# Patient Record
Sex: Male | Born: 1935 | Race: Black or African American | Hispanic: No | Smoking: Never smoker
Health system: Southern US, Community
[De-identification: ages and names within clinical notes are randomized; demographics above are authoritative.]

## PROBLEM LIST (undated history)

## (undated) DIAGNOSIS — I10 Essential (primary) hypertension: Secondary | ICD-10-CM

## (undated) DIAGNOSIS — K5900 Constipation, unspecified: Secondary | ICD-10-CM

## (undated) HISTORY — PX: BACK SURGERY: SHX140

---

## 2020-01-07 ENCOUNTER — Ambulatory Visit: Payer: No Typology Code available for payment source | Attending: Psychiatry | Admitting: Physical Therapy

## 2020-01-07 ENCOUNTER — Other Ambulatory Visit: Payer: Self-pay

## 2020-01-07 DIAGNOSIS — R29818 Other symptoms and signs involving the nervous system: Secondary | ICD-10-CM

## 2020-01-07 DIAGNOSIS — M6281 Muscle weakness (generalized): Secondary | ICD-10-CM | POA: Insufficient documentation

## 2020-01-07 DIAGNOSIS — R2681 Unsteadiness on feet: Secondary | ICD-10-CM | POA: Diagnosis present

## 2020-01-07 DIAGNOSIS — R293 Abnormal posture: Secondary | ICD-10-CM | POA: Diagnosis present

## 2020-01-10 ENCOUNTER — Encounter: Payer: Self-pay | Admitting: Physical Therapy

## 2020-01-10 NOTE — Therapy (Signed)
[facility] [address] [place], [place], [address] Phone: [phone]   Fax:  [phone]  Physical Therapy Evaluation  Patient Details  Name: Wayne Dudley MRN: [id number] Date of Birth: [birth year] Referring Provider (PT): Carrington Clamp, MD   Encounter Date: 01/07/2020  PT End of Session - 01/10/20 0915    Visit Number  1    Number of Visits  16   plus 15 visits from [place]   Date for PT Re-Evaluation  03/10/20    Authorization Type  VA - 15 authorized visits in 120 days (use by 05/08/20)    PT Start Time  1100    PT Stop Time  1145    PT Time Calculation (min)  45 min    Equipment Utilized During Treatment  Gait belt    Activity Tolerance  Patient tolerated treatment well;Patient limited by pain;Patient limited by fatigue    Behavior During Therapy  [facility] for tasks assessed/performed          01/07/20 1104  Symptoms/Limitations  Subjective Has progressive BLE weakness. Is non-ambulatory, has been using a w/c since december of 2020. Prior to that was using a RW (using that for almost 3 years). More weakness in LLE>RLE - starting in December. Feels like he has gotten weaker even since this past weekend. Scheduled to go to the neurosurgeon next month. No falls. Still undergoing workup for rapid muscle weakness, per note possibly undergoing EMG/NCV. Needs help getting out of bed, AM care, getting in and out of the shower, can no longer cook for himself or go to restroom on his own without assistance.  Son helps with stretching with legs and standing probably every other day. Son thinks he has gained his weight back now that he is eating more.  Pertinent History footpain 2/2 to frostbite injury, CKD, hard of hearing.  How long can you stand comfortably? can stand for about 1-2 minutes before having pain (in the feet and left ankle)  Diagnostic tests MRI of lumbar spine 11/10/19: multilevel degenerative changes, severe spinal  canal stenosis at L3-L4 and L5-S1  Patient Stated Goals wants to get back to where he can do for himself.  Pain Assessment  Currently in Pain? Yes  Pain Score 7  Pain Location Ankle  Pain Orientation Right;Left  Pain Descriptors / Indicators Aching  Pain Type Chronic pain (through january and december)  Aggravating Factors  standing  Pain Relieving Factors after taking tylenol       01/07/20 1115  Assessment  Medical Diagnosis progressive BLE weakness  Onset Date/Surgical Date 10/09/19  Prior Therapy had PT in [place] for 3-4 months (back when he was on his RW)  Precautions  Precautions Fall  Balance Screen  Has the patient fallen in the past 6 months No  Has the patient had a decrease in activity level because of a fear of falling?  Yes  Is the patient reluctant to leave their home because of a fear of falling?  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  Living Arrangements Children;Other (Comment) (son during the week, daughter on the weekends)  Available Help at Discharge Family  Type of Home House  Home Access Ramped entrance  Home Layout Able to live on main level with bedroom/bathroom;One level  Home Equipment Wheelchair - manual;Walker - standard;Shower seat;Other (comment) (elevated toilet seat )  Additional Comments living with family since December  Prior Function  Level of Independence Independent (prior to december)  Vocation Requirements retired Cytogeneticist  Observation/Other Assessments  Observations tightness B hamstrings/calf  Sensation  Light Touch Impaired by gross assessment  Proprioception Appears Intact (at B ankles)  Additional Comments able to detect on LLE, however pt reporting that light touch felt like therapist was touching with her fist  Coordination  Gross Motor Movements are Fluid and Coordinated No  Coordination and Movement Description unable to perform due to BLE weakness  Posture/Postural Control  Posture/Postural  Control Postural limitations  Postural Limitations Rounded Shoulders;Forward head;Posterior pelvic tilt  ROM / Strength  AROM / PROM / Strength Strength;PROM  PROM  Overall PROM Comments hypomobility B ankles and hamstrings, when lying supine pt with incr B knee flexion   Strength  Overall Strength Comments BUE: approx. 4/5 B in biceps/triceps, decr grip strength B (not formally assessed) - per pt this BUE weakness is new and started approx. 2 weeks ago  Strength Assessment Site Hip;Knee;Ankle  Right/Left Hip Right;Left  Right/Left Knee Right;Left  Right/Left Ankle Right;Left  Right Hip Flexion 2+/5  Left Hip Flexion 1/5  Right Knee Flexion 2+/5  Right Knee Extension 3+/5  Left Knee Flexion 1/5  Left Knee Extension 1/5  Right Ankle Dorsiflexion 2/5  Left Ankle Dorsiflexion 2/5  Bed Mobility  Bed Mobility Supine to Sit;Sit to Supine  Supine to Sit Supervision/Verbal cueing  Sit to Supine Supervision/Verbal cueing  Transfers  Transfers Sit to Stand;Stand to Sit;Lateral/Scoot Transfers  Sit to Stand 4: Min assist  Sit to Stand Details Visual cues for safe use of DME/AE;Verbal cues for sequencing;Verbal cues for technique;Visual cues/gestures for sequencing;Visual cues/gestures for precautions/safety  Sit to Stand Details (indicate cue type and reason) pt coming to stand with BUE support on RW   Stand to Sit 4: Min assist;With upper extremity assist;To bed  Stand to Sit Details (indicate cue type and reason) Verbal cues for sequencing;Verbal cues for technique;Visual cues for safe use of DME/AE;Verbal cues for precautions/safety  Lateral/Scoot Transfers 4: Min assist  Lateral/Scoot Transfer Details (indicate cue type and reason) from w/c <> mat table, needing cues to scoot towards edge of chair, blocking LLE   Comments standing with RW at edge of mat table: pt with forward flexed posture and knees bent B, therapist assisting by blocking LLE, 2 x 20-30 seconds, needing to sit after  reporting increased B foot pain, weight shifted more towards R    Ambulation/Gait  Gait Comments not safe to assess at this time.      Self Care: Discussed pressure relief - as pt is spending the majority of his time in his wheelchair or laying supine in the bed, discussed trying to stand briefly with son's support at least once every hour/rolling in bed and re-positioning to offset sacrum - will further discuss and educate at next session. Pt also complaining of new RLE weakness that started over the weekend and BUE weakness that is new and started approximately 2 weeks ago. Therapist educating and instructing pt and pt's son to call/message the doctor and make them aware of these new changes ASAP. Pt's son verbalized understanding and was going to send a message to the MD.     History reviewed. No pertinent past medical history.  History reviewed. No pertinent surgical history.  There were no vitals filed for this visit.                  Objective measurements completed on examination: See above findings.  PT Education - 01/10/20 0915    Education Details  clinical findings, POC, see self- care.    Person(s) Educated  Patient;Child(ren)    Methods  Explanation;Demonstration    Comprehension  Verbalized understanding;Returned demonstration       PT Short Term Goals - 01/10/20 2023      PT SHORT TERM GOAL #1   Title  Pt and pt's son/daughter will be independent in initial HEP for LE strengthening/ROM. ALL STGS DUE 02/14/20    Time  5   due to delay in scheduling   Period  Weeks    Status  New    Target Date  02/14/20   due to delay in scheduling     PT SHORT TERM GOAL #2   Title  Pt will perform sit <> stand with min guard with RW in order to improve functional LE strength.    Baseline  min A for sit <> stand, pt needing BUE on RW    Time  5    Period  Weeks    Status  New      PT SHORT TERM GOAL #3   Title  Pt and pt's son/daughter  will verbalize performing pressure relief at home in order to decr risk of pressure sores due to pt spending incr time in w/c and lying in bed.    Time  5    Period  Weeks    Status  New      PT SHORT TERM GOAL #4   Title  Pt will be able to stand for at least 3 minutes at RW with min guard in order to improve tolerance for ADLs.    Time  5    Period  Weeks    Status  New        PT Long Term Goals - 01/10/20 2026      PT LONG TERM GOAL #1   Title  Pt and pt's son/daughter will be independent in final HEP for LE strengthening/ROM. ALL LTGS DUE 03/13/20    Time  9   due to delay in scheduling   Period  Weeks    Status  New    Target Date  03/13/20      PT LONG TERM GOAL #2   Title  Pt will be able to perform squat pivot vs. stand pivot transfers with RW from w/c to mat table with supervision in order to decr caregiver burden.    Time  9    Period  Weeks    Status  New      PT LONG TERM GOAL #3   Title  Pt will perform sit <> stand from w/c and mat table with RW with supervision in order to decr caregiver burden.    Time  9    Period  Weeks    Status  New      PT LONG TERM GOAL #4   Title  Pt will tolerate standing activities at RW for at least 6 minutes in order to improve tolerance for ADLs.    Time  9    Period  Weeks    Status  New             Plan - 01/10/20 0917    Clinical Impression Statement  Patient is a 86 year old male referred to Neuro OPPT for evaluation with primary concern of progressive BLE weakness.  Pt's PMH is significant for: footpain 2/2 to frostbite injury, CKD. Pt noticed  increased weakness in LLE>RLE in December of 2020 - is now non-ambulatory and in a wheelchair. Prior to December of 2020 pt was ambulating with a RW. Pt now lives with his children and needs assistance with ADLs and IADLs. Pt's weakness is still under investigation. Pt reporting an increase in RLE weakness over the weekend and has noticed BUE weakness that started approx. 2  weeks ago. Therapist instructing pt and pt's son to let his MD know of these new changes immediately. The following deficits were present during the exam: BLE (L>R) weakness, BUE weakness, decr AROM, postural abnormalities in sitting and standing, impaired sensation, impaired static balance in standing. Pt would benefit from skilled PT to address these impairments and functional limitations to maximize functional mobility independence and decrease caregiver burden.    Personal Factors and Comorbidities  Age;Past/Current Experience    Examination-Activity Limitations  Bed Mobility;Stand;Transfers;Locomotion Level;Hygiene/Grooming;Bathing;Toileting    Examination-Participation Restrictions  Community Activity;Cleaning;Meal Prep    Stability/Clinical Decision Making  Evolving/Moderate complexity    Clinical Decision Making  Moderate    Rehab Potential  Fair    PT Frequency  2x / week    PT Duration  --   7 weeks, plus 1x week for 1 week for 15 visits total   PT Treatment/Interventions  Gait training;Therapeutic exercise;Neuromuscular re-education    PT Next Visit Plan  any update on when they followed up with MD? initial HEP for stretching/ standing with son. review pressure relief, transfers from w/c <> mat table, sit <> stands with RW from elevated surface    Recommended Other Services  potential OT consult?    Consulted and Agree with Plan of Care  Patient;Family member/caregiver    Family Member Consulted  pt's son       Patient will benefit from skilled therapeutic intervention in order to improve the following deficits and impairments:  Decreased balance, Decreased activity tolerance, Decreased coordination, Decreased knowledge of use of DME, Decreased mobility, Decreased range of motion, Difficulty walking, Decreased strength, Impaired flexibility, Postural dysfunction, Impaired sensation, Pain  Visit Diagnosis: Unsteadiness on feet - Plan: PT plan of care cert/re-cert  Muscle weakness  (generalized) - Plan: PT plan of care cert/re-cert  Other symptoms and signs involving the nervous system - Plan: PT plan of care cert/re-cert  Abnormal posture - Plan: PT plan of care cert/re-cert     Problem List There are no problems to display for this patient.   Arliss Journey, PT, DPT  01/10/2020, 8:31 PM  [facility] [address] [place], [place], [address] Phone: [phone]   Fax:  [phone]  Name: Wayne Dudley MRN: [id number] Date of Birth: 1923-08-28

## 2020-01-18 ENCOUNTER — Other Ambulatory Visit: Payer: Self-pay

## 2020-01-18 ENCOUNTER — Ambulatory Visit: Payer: No Typology Code available for payment source | Admitting: Physical Therapy

## 2020-01-18 DIAGNOSIS — M6281 Muscle weakness (generalized): Secondary | ICD-10-CM

## 2020-01-18 DIAGNOSIS — R293 Abnormal posture: Secondary | ICD-10-CM

## 2020-01-18 DIAGNOSIS — R29818 Other symptoms and signs involving the nervous system: Secondary | ICD-10-CM

## 2020-01-18 DIAGNOSIS — R2681 Unsteadiness on feet: Secondary | ICD-10-CM

## 2020-01-18 NOTE — Patient Instructions (Signed)
Access Code: [email or web address] URL: [email or web address] Date: 01/18/2020 Prepared by: Sherlie Ban  Program Notes lay on your back with towels rolled up under the outside of feet so toes are pointed up, stretching the hamstrings (back of the legs) lying down - hold this position approx. 3-5 minutes    keep working on standing with the walker.    Exercises Seated Hip Abduction with Resistance - 2 x daily - 5 x weekly - 2 sets - 10 reps Seated Hip Adduction Isometrics with Ball - 2 x daily - 5 x weekly - 2 sets - 10 reps Supine Gluteal Sets - 2 x daily - 5 x weekly - 2 sets - 10 reps Seated Heel Slide - 2 x daily - 5 x weekly - 2 sets - 10 reps Supine Hip and Knee Flexion PROM with Caregiver - 2 x daily - 5 x weekly - 3 sets - 20 hold Supine Ankle Dorsiflexion Stretch with Caregiver - 2 x daily - 5 x weekly - 3 sets - 20 hold

## 2020-01-18 NOTE — Therapy (Signed)
[facility] [address] [place], [place], [address] Phone: [phone]   Fax:  [phone]  Physical Therapy Treatment  Patient Details  Name: Wayne Dudley MRN: [id number] Date of Birth: [birth year] Referring Provider (PT): Carrington Clamp, MD   Encounter Date: 01/18/2020  PT End of Session - 01/18/20 1227    Visit Number  2    Number of Visits  16   plus 15 visits from [place]   Date for PT Re-Evaluation  03/10/20    Authorization Type  VA - 15 authorized visits in 120 days (use by 05/08/20)    PT Start Time  1103    PT Stop Time  1147    PT Time Calculation (min)  44 min    Equipment Utilized During Treatment  Gait belt    Activity Tolerance  Patient tolerated treatment well    Behavior During Therapy  [facility] for tasks assessed/performed       No past medical history on file.  No past surgical history on file.  There were no vitals filed for this visit.  Subjective Assessment - 01/18/20 1108    Subjective  Went to the neurosurgeon on the 15th - took a look at his MRIs and x-rays, the surgeon was saying the only way to correct the BLE weakness would be surgery, but not going to go that route. No falls. Becoming weaker on the right side.    Pertinent History  footpain 2/2 to frostbite injury, CKD, hard of hearing.    How long can you stand comfortably?  can stand for about 1-2 minutes before having pain (in the feet and left ankle)    Diagnostic tests  MRI of lumbar spine 11/10/19: multilevel degenerative changes, severe spinal canal stenosis at L3-L4 and L5-S1    Patient Stated Goals  wants to get back to where he can do for himself.    Currently in Pain?  Yes    Pain Score  6     Pain Location  Ankle    Pain Orientation  Right;Left    Pain Descriptors / Indicators  Burning    Aggravating Factors   standing                      Access Code: [email or web address] URL: [email or web address] Date:  01/18/2020 Prepared by: Sherlie Ban  Initiated pt's HEP for strengthening/ROM.   Program Notes lay on your back with towels rolled up under the outside of feet so toes are pointed up, stretching the hamstrings (back of the legs) lying down - hold this position approx. 3-5 minutes    keep working on standing with the walker (with son with you)    Exercises Seated Hip Abduction with Resistance - 2 x daily - 5 x weekly - 2 sets - 10 reps -with yellow theraband, alternating between R and L 2 x 10 reps  Seated Hip Adduction Isometrics with Ball - 2 x daily - 5 x week 2 sets - 10 reps -cues for technique 2 x 10 reps  Supine Gluteal Sets - 2 x daily - 5 x weekly - 2 sets - 10 reps - verbal and tactile cues for technique 2 x 10 reps, pt with poor muscle contraction on L  Seated Heel Slide - 2 x daily - 5 x weekly - 2 sets - 10 reps - AAROM with towel on floor, 2 x 10 reps on R, 2 x 10 reps on L with  therapist needing to assist back into knee flexion  Supine Hip and Knee Flexion PROM with Caregiver - 2 x daily - 5 x weekly - 3 sets - 20 hold - therapist demonstrating proper tehcnique for home stretch  Supine Ankle Dorsiflexion Stretch with Caregiver - 2 x daily - 5 x weekly - 3 sets - 20 hold  [facility] Adult PT Treatment/Exercise - 01/18/20 0001      Bed Mobility   Bed Mobility  Supine to Sit;Sit to Supine    Supine to Sit  Minimal Assistance - Patient > 75%   to help bring BLE off of mat table, assist at trunk   Sit to Supine  Minimal Assistance - Patient > 75%;Other (comment)   to help bring LLE onto mat     Transfers   Lateral/Scoot Transfers  4: Min assist;4: Min guard    Lateral/Scoot Transfer Details (indicate cue type and reason)  from w/c <> mat table, first rep going to pt's LLE (weaker side) - needing cues to scoot towards edge of w/c and min A for safety with transfer, at end of session with pt transferring back to R (stronger side) pt only requiring only min guard      Exercises    Exercises  --             PT Education - 01/18/20 1226    Education Details  initial HEP, calling MD to make him aware of BUE weakness (did not let him know at recent neurosurgeon appt)    Person(s) Educated  Patient;Child(ren)    Methods  Explanation;Demonstration;Handout    Comprehension  Verbalized understanding;Returned demonstration       PT Short Term Goals - 01/10/20 2023      PT SHORT TERM GOAL #1   Title  Pt and pt's son/daughter will be independent in initial HEP for LE strengthening/ROM. ALL STGS DUE 02/14/20    Time  5   due to delay in scheduling   Period  Weeks    Status  New    Target Date  02/14/20   due to delay in scheduling     PT SHORT TERM GOAL #2   Title  Pt will perform sit <> stand with min guard with RW in order to improve functional LE strength.    Baseline  min A for sit <> stand, pt needing BUE on RW    Time  5    Period  Weeks    Status  New      PT SHORT TERM GOAL #3   Title  Pt and pt's son/daughter will verbalize performing pressure relief at home in order to decr risk of pressure sores due to pt spending incr time in w/c and lying in bed.    Time  5    Period  Weeks    Status  New      PT SHORT TERM GOAL #4   Title  Pt will be able to stand for at least 3 minutes at RW with min guard in order to improve tolerance for ADLs.    Time  5    Period  Weeks    Status  New        PT Long Term Goals - 01/10/20 2026      PT LONG TERM GOAL #1   Title  Pt and pt's son/daughter will be independent in final HEP for LE strengthening/ROM. ALL LTGS DUE 03/13/20    Time  9  due to delay in scheduling   Period  Weeks    Status  New    Target Date  03/13/20      PT LONG TERM GOAL #2   Title  Pt will be able to perform squat pivot vs. stand pivot transfers with RW from w/c to mat table with supervision in order to decr caregiver burden.    Time  9    Period  Weeks    Status  New      PT LONG TERM GOAL #3   Title  Pt will perform sit <>  stand from w/c and mat table with RW with supervision in order to decr caregiver burden.    Time  9    Period  Weeks    Status  New      PT LONG TERM GOAL #4   Title  Pt will tolerate standing activities at RW for at least 6 minutes in order to improve tolerance for ADLs.    Time  9    Period  Weeks    Status  New            Plan - 01/18/20 1237    Clinical Impression Statement  Focus of today's skilled session was initiating pt's HEP for seated/supine strengthening and for stretching to be assisted by a caregiver (pt's son Jeneen Rinks present throughout session). pt with increased weakness LLE>RLE. Pt with hypomobility of B hamstrings and B hip flexors/extensors, with pt reporting relief from stretching. Pt needing min A to transfer towards pt's L with cues for technique as pt always transfers to R side from his w/c. Will continue to progress towards LTGs.    Personal Factors and Comorbidities  Age;Past/Current Experience    Examination-Activity Limitations  Bed Mobility;Stand;Transfers;Locomotion Level;Hygiene/Grooming;Bathing;Toileting    Examination-Participation Restrictions  Community Activity;Cleaning;Meal Prep    Stability/Clinical Decision Making  Evolving/Moderate complexity    Rehab Potential  Fair    PT Frequency  2x / week    PT Duration  --   7 weeks, plus 1x week for 1 week for 15 visits total   PT Treatment/Interventions  Gait training;Therapeutic exercise;Neuromuscular re-education    PT Next Visit Plan  any update on when they followed up with MD? how was HEP? standing at countertop. review pressure relief, transfers from w/c <> mat table, sit <> stands with RW from elevated surface    Consulted and Agree with Plan of Care  Patient;Family member/caregiver    Family Member Consulted  pt's son       Patient will benefit from skilled therapeutic intervention in order to improve the following deficits and impairments:  Decreased balance, Decreased activity tolerance,  Decreased coordination, Decreased knowledge of use of DME, Decreased mobility, Decreased range of motion, Difficulty walking, Decreased strength, Impaired flexibility, Postural dysfunction, Impaired sensation, Pain  Visit Diagnosis: Muscle weakness (generalized)  Unsteadiness on feet  Other symptoms and signs involving the nervous system  Abnormal posture     Problem List There are no problems to display for this patient.   Arliss Journey, PT, DPT  01/18/2020, 12:40 PM  [place] [address] [place], [place], [address] Phone: [phone]   Fax:  [phone]  Name: Wayne Dudley MRN: [id number] Date of Birth: 06-08-1923

## 2020-01-25 ENCOUNTER — Other Ambulatory Visit: Payer: Self-pay

## 2020-01-25 ENCOUNTER — Ambulatory Visit: Payer: No Typology Code available for payment source | Admitting: Physical Therapy

## 2020-01-25 VITALS — BP 161/61 | HR 63

## 2020-01-25 DIAGNOSIS — R2681 Unsteadiness on feet: Secondary | ICD-10-CM | POA: Diagnosis not present

## 2020-01-25 DIAGNOSIS — M6281 Muscle weakness (generalized): Secondary | ICD-10-CM

## 2020-01-25 DIAGNOSIS — R29818 Other symptoms and signs involving the nervous system: Secondary | ICD-10-CM

## 2020-01-25 DIAGNOSIS — R293 Abnormal posture: Secondary | ICD-10-CM

## 2020-01-27 NOTE — Therapy (Signed)
[facility] [address] [place], [place], [address] Phone: [phone]   Fax:  [phone]  Physical Therapy Treatment  Patient Details  Name: Wayne Dudley MRN: [id number] Date of Birth: 1923-06-21 Referring Provider (PT): Carrington Clamp, MD   Encounter Date: 01/25/2020  PT End of Session - 01/27/20 1503    Visit Number  3    Number of Visits  16   plus 15 visits from [place]   Date for PT Re-Evaluation  03/10/20    Authorization Type  VA - 15 authorized visits in 120 days (use by 05/08/20)    PT Start Time  1102    PT Stop Time  1147    PT Time Calculation (min)  45 min    Equipment Utilized During Treatment  Gait belt    Activity Tolerance  Patient tolerated treatment well    Behavior During Therapy  [facility] for tasks assessed/performed       No past medical history on file.  No past surgical history on file.  Vitals:   01/25/20 1108  BP: (!) 161/61  Pulse: 63                      [facility] Adult PT Treatment/Exercise - 01/27/20 0001      Bed Mobility   Bed Mobility  Supine to Sit;Sit to Supine    Supine to Sit  Minimal Assistance - Patient > 75%   for assist with BLEs   Sit to Supine  Minimal Assistance - Patient > 75%;Other (comment)   for assist with BLEs     Transfers   Transfers  Sit to Stand;Stand to Sit;Squat Pivot Transfers    Sit to Stand  4: Min assist    Sit to Stand Details  Verbal cues for sequencing;Verbal cues for technique;Tactile cues for placement    Sit to Stand Details (indicate cue type and reason)  at countertop with single UE support on counter and single UE support from w/c x3, pt with uncontrolled descent while sitting in chair    Squat Pivot Transfers  4: Min assist;4: Min Product/process development scientist Details (indicate cue type and reason)  going towards LLE - min A, min guard going towards R back into w/c from mat table, verbal and demonstrative cues for head hips  relationship for squat pivot transfer    Lateral/Scoot Transfers  --    Lateral/Scoot Transfer Details (indicate cue type and reason)  --    Comments  Standing at countertop 3 x 30-45 second bouts with BUE support at sink, min A, pt with incr forward flexed posture. HR after standing: 74 bpm      Exercises   Exercises  Other Exercises    Other Exercises   Supine on mat table in hooklying position and bolster under BLEs: 2 x 10 reps SAQs on RLE, 1 x 10 AAROM SAQs on LLE, with LLE extended attempted quad sets on LLE - with trace contraction and pt compensating by using glutes. In hooklying position (needed bolster propped next to RLE and therapist holding LLE in place to prevent hip ADD) 1 x 10 reps hip ADD with ball, therapist assisting by holding ball, 1 x 10 reps hip ABD - asking pt to gently press out into therapist's hands. PROM and stretch into hip and knee flexion with additional calf stretch B.  PT Short Term Goals - 01/10/20 2023      PT SHORT TERM GOAL #1   Title  Pt and pt's son/daughter will be independent in initial HEP for LE strengthening/ROM. ALL STGS DUE 02/14/20    Time  5   due to delay in scheduling   Period  Weeks    Status  New    Target Date  02/14/20   due to delay in scheduling     PT SHORT TERM GOAL #2   Title  Pt will perform sit <> stand with min guard with RW in order to improve functional LE strength.    Baseline  min A for sit <> stand, pt needing BUE on RW    Time  5    Period  Weeks    Status  New      PT SHORT TERM GOAL #3   Title  Pt and pt's son/daughter will verbalize performing pressure relief at home in order to decr risk of pressure sores due to pt spending incr time in w/c and lying in bed.    Time  5    Period  Weeks    Status  New      PT SHORT TERM GOAL #4   Title  Pt will be able to stand for at least 3 minutes at RW with min guard in order to improve tolerance for ADLs.    Time  5    Period  Weeks    Status  New         PT Long Term Goals - 01/10/20 2026      PT LONG TERM GOAL #1   Title  Pt and pt's son/daughter will be independent in final HEP for LE strengthening/ROM. ALL LTGS DUE 03/13/20    Time  9   due to delay in scheduling   Period  Weeks    Status  New    Target Date  03/13/20      PT LONG TERM GOAL #2   Title  Pt will be able to perform squat pivot vs. stand pivot transfers with RW from w/c to mat table with supervision in order to decr caregiver burden.    Time  9    Period  Weeks    Status  New      PT LONG TERM GOAL #3   Title  Pt will perform sit <> stand from w/c and mat table with RW with supervision in order to decr caregiver burden.    Time  9    Period  Weeks    Status  New      PT LONG TERM GOAL #4   Title  Pt will tolerate standing activities at RW for at least 6 minutes in order to improve tolerance for ADLs.    Time  9    Period  Weeks    Status  New            Plan - 01/27/20 1515    Clinical Impression Statement  Focus of today's skilled session was BLE PROM and strengthening, transfer training, and standing at sink with BUE support. Pt needing verbal and demonstrative cues for head hips relationship - pt needing min A when transferring to pt's L. Pt with trace L quad activation when attempting supine quad sets. Unable to maintain hooklying positon with knees bent without external support of bolster/therapist due to weakness. Will continue to progress towards LTGs.    Personal Factors and Comorbidities  Age;Past/Current Experience    Examination-Activity Limitations  Bed Mobility;Stand;Transfers;Locomotion Level;Hygiene/Grooming;Bathing;Toileting    Examination-Participation Restrictions  Community Activity;Cleaning;Meal Prep    Stability/Clinical Decision Making  Evolving/Moderate complexity    Rehab Potential  Fair    PT Frequency  2x / week    PT Duration  --   7 weeks, plus 1x week for 1 week for 15 visits total   PT Treatment/Interventions  Gait  training;Therapeutic exercise;Neuromuscular re-education    PT Next Visit Plan  how is HEP going?  supine/hooklying BLE strengthening/PROM, review pressure relief, transfers from w/c <> mat table, sit <> stands/standing at sink    Consulted and Agree with Plan of Care  Patient;Family member/caregiver    Family Member Consulted  pt's son       Patient will benefit from skilled therapeutic intervention in order to improve the following deficits and impairments:  Decreased balance, Decreased activity tolerance, Decreased coordination, Decreased knowledge of use of DME, Decreased mobility, Decreased range of motion, Difficulty walking, Decreased strength, Impaired flexibility, Postural dysfunction, Impaired sensation, Pain  Visit Diagnosis: Muscle weakness (generalized)  Unsteadiness on feet  Other symptoms and signs involving the nervous system  Abnormal posture     Problem List There are no problems to display for this patient.   Arliss Journey, PT, DPT  01/27/2020, 3:22 PM  [facility] [address] [place], [place], [address] Phone: [phone]   Fax:  [phone]  Name: Wayne Dudley MRN: [id number] Date of Birth: Jun 30, 1923

## 2020-01-30 ENCOUNTER — Other Ambulatory Visit: Payer: Self-pay

## 2020-01-30 ENCOUNTER — Ambulatory Visit: Payer: No Typology Code available for payment source

## 2020-01-30 DIAGNOSIS — R2681 Unsteadiness on feet: Secondary | ICD-10-CM | POA: Diagnosis not present

## 2020-01-30 DIAGNOSIS — M6281 Muscle weakness (generalized): Secondary | ICD-10-CM

## 2020-01-30 NOTE — Therapy (Signed)
[facility] [address] [place], [place], [address] Phone: [phone]   Fax:  [phone]  Physical Therapy Treatment  Patient Details  Name: Wayne Dudley MRN: [id number] Date of Birth: 10-Jul-1923 Referring Provider (PT): [name], MD   Encounter Date: 01/30/2020  PT End of Session - 01/30/20 1105    Visit Number  4    Number of Visits  16   plus 15 visits from [place]   Date for PT Re-Evaluation  03/10/20    Authorization Type  VA - 15 authorized visits in 120 days (use by 05/08/20)    PT Start Time  1102    PT Stop Time  1147    PT Time Calculation (min)  45 min    Equipment Utilized During Treatment  Gait belt    Activity Tolerance  Patient tolerated treatment well    Behavior During Therapy  [facility] for tasks assessed/performed       History reviewed. No pertinent past medical history.  History reviewed. No pertinent surgical history.  There were no vitals filed for this visit.  Subjective Assessment - 01/30/20 1108    Subjective  Pt's son reports that he did speak to the doctor via messages and notify him of UE weakness. They wanted more information and he let them know that therapist would call.    Pertinent History  footpain 2/2 to frostbite injury, CKD, hard of hearing.    How long can you stand comfortably?  can stand for about 1-2 minutes before having pain (in the feet and left ankle)    Diagnostic tests  MRI of lumbar spine 11/10/19: multilevel degenerative changes, severe spinal canal stenosis at L3-L4 and L5-S1    Patient Stated Goals  wants to get back to where he can do for himself.    Currently in Pain?  Yes    Pain Score  6     Pain Location  Foot    Pain Orientation  Right;Left    Pain Descriptors / Indicators  Burning    Pain Type  Chronic pain                       [facility] Adult PT Treatment/Exercise - 01/30/20 1129      Transfers   Transfers  Sit to Stand;Stand to Sit     Sit to Stand  4: Min assist    Sit to Stand Details  Verbal cues for technique    Sit to Stand Details (indicate cue type and reason)  at sink with 1 hand on sink and 1 hand on chair pushing up.    Stand to Sit  4: Min assist    Stand to Sit Details (indicate cue type and reason)  Verbal cues for technique    Squat Pivot Transfers  4: Min assist    Squat Pivot Transfer Details (indicate cue type and reason)  w/c to/from mat x 2    Comments  Pt stood 1 min x 2 and then 47 sec at sink with verbal and tactile cues to try to tighten bottom and quads and keep head up tall. As patient is up longer knees more flexed and start to touch cabinets. PT suggested pillow in front of knees at home if this is causing discomfort      Self-Care   Self-Care  Other Self-Care Comments    Other Self-Care Comments   Pt and son educated on pressure relief in sitting.  Need to change positions every 20 min for 2 min at a time. Discussed different options but pt able to self perform lateral lean with PT able to get hand under coccyx as providing adequate relief. Also encouraged him to stand with son's assist at counter/sink frequently.      Exercises   Exercises  Other Exercises    Other Exercises   PT reviewed HEP with patient and son: seated edge of mat hip abduction isometrics with light manual resistance x 10 with minimal activation right>left. Isometric hip adduction squeezing pillow x 10 with minimal activation. Seated heel slides x 6 each leg with towel under foot to help allow more slide through partial range. Pt was able to extend to slide right foot back out but not on left. Son verbalized performing supine stretching/ROM with patient.             PT Education - 01/30/20 1636    Education Details  Pt to continue with current HEP. Pressure relief.    Person(s) Educated  Patient;Child(ren)    Methods  Explanation    Comprehension  Verbalized understanding       PT Short Term Goals - 01/10/20 2023       PT SHORT TERM GOAL #1   Title  Pt and pt's son/daughter will be independent in initial HEP for LE strengthening/ROM. ALL STGS DUE 02/14/20    Time  5   due to delay in scheduling   Period  Weeks    Status  New    Target Date  02/14/20   due to delay in scheduling     PT SHORT TERM GOAL #2   Title  Pt will perform sit <> stand with min guard with RW in order to improve functional LE strength.    Baseline  min A for sit <> stand, pt needing BUE on RW    Time  5    Period  Weeks    Status  New      PT SHORT TERM GOAL #3   Title  Pt and pt's son/daughter will verbalize performing pressure relief at home in order to decr risk of pressure sores due to pt spending incr time in w/c and lying in bed.    Time  5    Period  Weeks    Status  New      PT SHORT TERM GOAL #4   Title  Pt will be able to stand for at least 3 minutes at RW with min guard in order to improve tolerance for ADLs.    Time  5    Period  Weeks    Status  New        PT Long Term Goals - 01/10/20 2026      PT LONG TERM GOAL #1   Title  Pt and pt's son/daughter will be independent in final HEP for LE strengthening/ROM. ALL LTGS DUE 03/13/20    Time  9   due to delay in scheduling   Period  Weeks    Status  New    Target Date  03/13/20      PT LONG TERM GOAL #2   Title  Pt will be able to perform squat pivot vs. stand pivot transfers with RW from w/c to mat table with supervision in order to decr caregiver burden.    Time  9    Period  Weeks    Status  New      PT LONG  TERM GOAL #3   Title  Pt will perform sit <> stand from w/c and mat table with RW with supervision in order to decr caregiver burden.    Time  9    Period  Weeks    Status  New      PT LONG TERM GOAL #4   Title  Pt will tolerate standing activities at RW for at least 6 minutes in order to improve tolerance for ADLs.    Time  9    Period  Weeks    Status  New            Plan - 01/30/20 1637    Clinical Impression Statement  Pt  was able to increase standing time at counter today with more upright posture. Knees do flex as goes on. Pt wil continue to benefit from PT to continue with transfers, standing and strengthening.    Personal Factors and Comorbidities  Age;Past/Current Experience    Examination-Activity Limitations  Bed Mobility;Stand;Transfers;Locomotion Level;Hygiene/Grooming;Bathing;Toileting    Examination-Participation Restrictions  Community Activity;Cleaning;Meal Prep    Stability/Clinical Decision Making  Evolving/Moderate complexity    Rehab Potential  Fair    PT Frequency  2x / week    PT Duration  --   7 weeks, plus 1x week for 1 week for 15 visits total   PT Treatment/Interventions  Gait training;Therapeutic exercise;Neuromuscular re-education    PT Next Visit Plan  supine/hooklying BLE strengthening/PROM, review pressure relief, transfers from w/c <> mat table, sit <> stands/standing at sink    Consulted and Agree with Plan of Care  Patient;Family member/caregiver    Family Member Consulted  pt's son       Patient will benefit from skilled therapeutic intervention in order to improve the following deficits and impairments:  Decreased balance, Decreased activity tolerance, Decreased coordination, Decreased knowledge of use of DME, Decreased mobility, Decreased range of motion, Difficulty walking, Decreased strength, Impaired flexibility, Postural dysfunction, Impaired sensation, Pain  Visit Diagnosis: Muscle weakness (generalized)  Unsteadiness on feet     Problem List There are no problems to display for this patient.   Ronn Melena, PT, DPT, NCS 01/30/2020, 4:41 PM  [facility] [address] [place], [place], [address] Phone: [phone]   Fax:  [phone]  Name: Wayne Dudley MRN: [id number] Date of Birth: 1923/05/02

## 2020-02-05 ENCOUNTER — Ambulatory Visit: Payer: No Typology Code available for payment source | Attending: Psychiatry | Admitting: Physical Therapy

## 2020-02-05 ENCOUNTER — Other Ambulatory Visit: Payer: Self-pay

## 2020-02-05 DIAGNOSIS — R293 Abnormal posture: Secondary | ICD-10-CM | POA: Insufficient documentation

## 2020-02-05 DIAGNOSIS — M6281 Muscle weakness (generalized): Secondary | ICD-10-CM | POA: Diagnosis present

## 2020-02-05 DIAGNOSIS — R29818 Other symptoms and signs involving the nervous system: Secondary | ICD-10-CM | POA: Insufficient documentation

## 2020-02-05 DIAGNOSIS — R2681 Unsteadiness on feet: Secondary | ICD-10-CM | POA: Insufficient documentation

## 2020-02-05 NOTE — Patient Instructions (Signed)
Access Code: [email or web address] URL: [email or web address] Date: 02/05/2020 Prepared by: Sherlie Ban  Program Notes lay on your back with towels rolled up under the outside of feet so toes are pointed up, stretching the hamstrings (back of the legs) lying down - hold this position approx. 3-5 minutes    keep working on standing with the walker.    Exercises Seated Hip Abduction with Resistance - 2 x daily - 5 x weekly - 2 sets - 10 reps Seated Hip Adduction Isometrics with Ball - 2 x daily - 5 x weekly - 2 sets - 10 reps Supine Gluteal Sets - 2 x daily - 5 x weekly - 2 sets - 10 reps Seated Heel Slide - 2 x daily - 5 x weekly - 2 sets - 10 reps Supine Hip and Knee Flexion PROM with Caregiver - 2 x daily - 5 x weekly - 3 sets - 20 hold Supine Ankle Dorsiflexion Stretch with Caregiver - 2 x daily - 5 x weekly - 3 sets - 20 hold Seated Scapular Retraction - 1-2 x daily - 5 x weekly - 2 sets - 10 reps

## 2020-02-05 NOTE — Therapy (Signed)
[facility] [address] [place], [place], [address] Phone: [phone]   Fax:  [phone]  Physical Therapy Treatment  Patient Details  Name: Precious Gilchrest MRN: [id number] Date of Birth: 19-May-1923 Referring Provider (PT): [name], MD   Encounter Date: 02/05/2020  PT End of Session - 02/05/20 1150    Visit Number  5    Number of Visits  16   plus 15 visits from [place]   Date for PT Re-Evaluation  03/10/20    Authorization Type  VA - 15 authorized visits in 120 days (use by 05/08/20)    PT Start Time  1059    PT Stop Time  1145    PT Time Calculation (min)  46 min    Equipment Utilized During Treatment  Gait belt    Activity Tolerance  Patient tolerated treatment well    Behavior During Therapy  [facility] for tasks assessed/performed       No past medical history on file.  No past surgical history on file.  There were no vitals filed for this visit.  Subjective Assessment - 02/05/20 1102    Subjective  Has an appointment to see the doctor tomorrow. Reports that his left leg feels like it is getting stronger. Reports doing his pressure relif in his chair. Has been doing some standing at the countertop.    Pertinent History  footpain 2/2 to frostbite injury, CKD, hard of hearing.    How long can you stand comfortably?  can stand for about 1-2 minutes before having pain (in the feet and left ankle)    Diagnostic tests  MRI of lumbar spine 11/10/19: multilevel degenerative changes, severe spinal canal stenosis at L3-L4 and L5-S1    Patient Stated Goals  wants to get back to where he can do for himself.    Currently in Pain?  Yes    Pain Score  6     Pain Location  Foot    Pain Orientation  Right;Left    Pain Descriptors / Indicators  Burning                       [facility] Adult PT Treatment/Exercise - 02/05/20 0001      Bed Mobility   Bed Mobility  Supine to Sit;Sit to Supine    Supine to Sit   Minimal Assistance - Patient > 75%   for assist with BLEs   Sit to Supine  Minimal Assistance - Patient > 75%;Other (comment)   assist with LLE onto mat     Transfers   Transfers  Sit to Stand;Stand to Sit    Sit to Stand  4: Min assist    Sit to Stand Details  Verbal cues for technique    Sit to Stand Details (indicate cue type and reason)  at sink with 1 hand on sink and one hand on chair pushing up x3 reps    Stand to Sit  4: Min assist    Stand to Sit Details (indicate cue type and reason)  Verbal cues for technique    Squat Pivot Transfers  4: Min assist    Squat Pivot Transfer Details (indicate cue type and reason)  w/c <> mat table x2 reps - needed verbal and demonstrative cues for head/hips relationship     Comments  Standing at  sink countertop with BUE support 1 minute x2 reps and an additional 45 seconds, verbal and tactile cues for glute  activation and standing up tall. Cues to keep head up as well. Use of pillow in front of knees due to pt having incr knee flexion as time went on       Exercises   Exercises  Other Exercises    Other Exercises   Seated on mat table: 2 x 10 reps scap retraction - needed verbal and tactile cues for technique, added to HEP. In hookyling on mat table with BLE over bolster: 2 x 10 reps SAQs with RLE, needing min A to hold RLE in proper positioning, visual cue for having pt to kick up to therapist's hands, 2 x 10 reps AAROM SAQs with LLE, pt able to activate L quads, therapist assisting with remainder of motion. In hooklying position (therapist supporting one leg and bolster supporting other leg to prevent it from ABDucting) - 1 x 10 reps therapist manual resistance and AAROM hip ADD/ABD on L, 1 x 10 reps on RLE, with minimal activation L>R. Therapist performing PROM of hip/knee flexion with prolonged stretch 3 x 1 min B.               PT Education - 02/05/20 1150    Education Details  scapular retraction seated addition to HEP    Person(s)  Educated  Patient;Child(ren)    Methods  Explanation;Demonstration    Comprehension  Verbalized understanding;Returned demonstration       PT Short Term Goals - 01/10/20 2023      PT SHORT TERM GOAL #1   Title  Pt and pt's son/daughter will be independent in initial HEP for LE strengthening/ROM. ALL STGS DUE 02/14/20    Time  5   due to delay in scheduling   Period  Weeks    Status  New    Target Date  02/14/20   due to delay in scheduling     PT SHORT TERM GOAL #2   Title  Pt will perform sit <> stand with min guard with RW in order to improve functional LE strength.    Baseline  min A for sit <> stand, pt needing BUE on RW    Time  5    Period  Weeks    Status  New      PT SHORT TERM GOAL #3   Title  Pt and pt's son/daughter will verbalize performing pressure relief at home in order to decr risk of pressure sores due to pt spending incr time in w/c and lying in bed.    Time  5    Period  Weeks    Status  New      PT SHORT TERM GOAL #4   Title  Pt will be able to stand for at least 3 minutes at RW with min guard in order to improve tolerance for ADLs.    Time  5    Period  Weeks    Status  New        PT Long Term Goals - 01/10/20 2026      PT LONG TERM GOAL #1   Title  Pt and pt's son/daughter will be independent in final HEP for LE strengthening/ROM. ALL LTGS DUE 03/13/20    Time  9   due to delay in scheduling   Period  Weeks    Status  New    Target Date  03/13/20      PT LONG TERM GOAL #2   Title  Pt will be able to perform squat pivot vs. stand  pivot transfers with RW from w/c to mat table with supervision in order to decr caregiver burden.    Time  9    Period  Weeks    Status  New      PT LONG TERM GOAL #3   Title  Pt will perform sit <> stand from w/c and mat table with RW with supervision in order to decr caregiver burden.    Time  9    Period  Weeks    Status  New      PT LONG TERM GOAL #4   Title  Pt will tolerate standing activities at RW for  at least 6 minutes in order to improve tolerance for ADLs.    Time  9    Period  Weeks    Status  New            Plan - 02/05/20 1205    Clinical Impression Statement  Pt able to stand for a max of 1 minute at the countertop today with BUE support at sink, needed intermittent cues for tall posture throughout. Pt continues to need verbal and demonstrative cues for squat pivot transfers to and from w/c <> mat table. Added seated scapular retraction to HEP for postural strengthening today, pt able to perform after initial verbal and tactile cues. Will continue to progress towards LTGs.    Personal Factors and Comorbidities  Age;Past/Current Experience    Examination-Activity Limitations  Bed Mobility;Stand;Transfers;Locomotion Level;Hygiene/Grooming;Bathing;Toileting    Examination-Participation Restrictions  Community Activity;Cleaning;Meal Prep    Stability/Clinical Decision Making  Evolving/Moderate complexity    Rehab Potential  Fair    PT Frequency  2x / week    PT Duration  --   7 weeks, plus 1x week for 1 week for 15 visits total   PT Treatment/Interventions  Gait training;Therapeutic exercise;Neuromuscular re-education    PT Next Visit Plan  how was MD visit ? supine/hooklying BLE strengthening/PROM, review pressure relief, transfers from w/c <> mat table, sit <> stands/standing at sink    Consulted and Agree with Plan of Care  Patient;Family member/caregiver    Family Member Consulted  pt's son       Patient will benefit from skilled therapeutic intervention in order to improve the following deficits and impairments:  Decreased balance, Decreased activity tolerance, Decreased coordination, Decreased knowledge of use of DME, Decreased mobility, Decreased range of motion, Difficulty walking, Decreased strength, Impaired flexibility, Postural dysfunction, Impaired sensation, Pain  Visit Diagnosis: Other symptoms and signs involving the nervous system  Abnormal posture  Muscle  weakness (generalized)  Unsteadiness on feet     Problem List There are no problems to display for this patient.   Drake Leach, PT, DPT  02/05/2020, 12:07 PM  [facility] [address] [place], [place], [address] Phone: [phone]   Fax:  [phone]  Name: Burak Zerbe MRN: [id number] Date of Birth: 04/05/1923

## 2020-02-08 ENCOUNTER — Ambulatory Visit: Payer: No Typology Code available for payment source | Admitting: Physical Therapy

## 2020-02-08 ENCOUNTER — Other Ambulatory Visit: Payer: Self-pay

## 2020-02-08 DIAGNOSIS — R2681 Unsteadiness on feet: Secondary | ICD-10-CM

## 2020-02-08 DIAGNOSIS — R293 Abnormal posture: Secondary | ICD-10-CM

## 2020-02-08 DIAGNOSIS — R29818 Other symptoms and signs involving the nervous system: Secondary | ICD-10-CM

## 2020-02-08 DIAGNOSIS — M6281 Muscle weakness (generalized): Secondary | ICD-10-CM

## 2020-02-08 NOTE — Therapy (Signed)
[facility] [address] [place], [place], [address] Phone: [phone]   Fax:  [phone]  Physical Therapy Treatment  Patient Details  Name: Wayne Dudley MRN: [id number] Date of Birth: [birth year] Referring Provider (PT): Carrington Clamp, MD   Encounter Date: 02/08/2020  PT End of Session - 02/08/20 1802    Visit Number  6    Number of Visits  16   plus 15 visits from [place]   Date for PT Re-Evaluation  03/10/20    Authorization Type  VA - 15 authorized visits in 120 days (use by 05/08/20)    PT Start Time  1103    PT Stop Time  1145    PT Time Calculation (min)  42 min    Equipment Utilized During Treatment  Gait belt    Activity Tolerance  Patient tolerated treatment well    Behavior During Therapy  [facility] for tasks assessed/performed       No past medical history on file.  No past surgical history on file.  There were no vitals filed for this visit.  Subjective Assessment - 02/08/20 1108    Subjective  Saw the doctor on Wednesday. Not getting any more further tests or x-rays for BUE weakness. States that he instead is getting an orthopedic PT referral.    Pertinent History  footpain 2/2 to frostbite injury, CKD, hard of hearing.    How long can you stand comfortably?  can stand for about 1-2 minutes before having pain (in the feet and left ankle)    Diagnostic tests  MRI of lumbar spine 11/10/19: multilevel degenerative changes, severe spinal canal stenosis at L3-L4 and L5-S1    Patient Stated Goals  wants to get back to where he can do for himself.    Currently in Pain?  Yes    Pain Score  6     Pain Location  Foot    Pain Orientation  Right;Left    Pain Descriptors / Indicators  Burning    Pain Type  Chronic pain                       [facility] Adult PT Treatment/Exercise - 02/08/20 0001      Transfers   Transfers  Sit to Stand;Stand to Sit    Sit to Stand  4: Min assist    Sit to Stand  Details  Verbal cues for technique    Sit to Stand Details (indicate cue type and reason)  at sink - 2 reps with BUE on sink, 2 reps with one hand on sink and one hand on w/c     Stand to Sit  4: Min assist    Stand to Sit Details (indicate cue type and reason)  Verbal cues for technique    Stand to Sit Details  pt with poor eccentric control when sitting back to chair after standing     Squat Pivot Transfers  4: Min assist   min guard   Squat Pivot Transfer Details (indicate cue type and reason)  w/c <> mat table x2 reps, pt needing verbal cues for proper technique    Comments  Standing at sink with w/c posteriorly: 1 rep of 1:30 seconds, 3 reps of 30 seconds, pillow placed anteriorly to knees due to pt demonstrating incr knee flexion as time goes on. Needs cues for posture to avoid the cabinets      Exercises   Exercises  Other Exercises  Other Exercises   Seated at edge of mat: 1 x 10 reps hip ABD with manual therapist resistance, pt with decr LLE activation, 1 x 5 reps modified curl ups with pt leaning backwards into posterior back support and then coming forward back to sitting for incr core activation - pt with incr difficulty performing. Seated hamstring stretch with therapist assisting bring LLE up into extension 3 x 30 seconds, pt reporting relief afterwards.      Knee/Hip Exercises: Aerobic   Other Aerobic  SciFit at level 1.0 for 4:30 with BLE and BUE for strengthening, ROM, circulation, and activity tolerance . Wooden blocks placed posteriorly to w/c wheels to prevent tipping.  O2 sats at 92% afterwards, needed cues for pursed lip breathing with sats incr back to 96%             PT Education - 02/08/20 1802    Education Details  continue with HEP, bring in notes from recent [place] appointment    Person(s) Educated  Patient;Child(ren)    Methods  Explanation    Comprehension  Verbalized understanding       PT Short Term Goals - 01/10/20 2023      PT SHORT TERM GOAL #1    Title  Pt and pt's son/daughter will be independent in initial HEP for LE strengthening/ROM. ALL STGS DUE 02/14/20    Time  5   due to delay in scheduling   Period  Weeks    Status  New    Target Date  02/14/20   due to delay in scheduling     PT SHORT TERM GOAL #2   Title  Pt will perform sit <> stand with min guard with RW in order to improve functional LE strength.    Baseline  min A for sit <> stand, pt needing BUE on RW    Time  5    Period  Weeks    Status  New      PT SHORT TERM GOAL #3   Title  Pt and pt's son/daughter will verbalize performing pressure relief at home in order to decr risk of pressure sores due to pt spending incr time in w/c and lying in bed.    Time  5    Period  Weeks    Status  New      PT SHORT TERM GOAL #4   Title  Pt will be able to stand for at least 3 minutes at RW with min guard in order to improve tolerance for ADLs.    Time  5    Period  Weeks    Status  New        PT Long Term Goals - 01/10/20 2026      PT LONG TERM GOAL #1   Title  Pt and pt's son/daughter will be independent in final HEP for LE strengthening/ROM. ALL LTGS DUE 03/13/20    Time  9   due to delay in scheduling   Period  Weeks    Status  New    Target Date  03/13/20      PT LONG TERM GOAL #2   Title  Pt will be able to perform squat pivot vs. stand pivot transfers with RW from w/c to mat table with supervision in order to decr caregiver burden.    Time  9    Period  Weeks    Status  New      PT LONG TERM GOAL #3  Title  Pt will perform sit <> stand from w/c and mat table with RW with supervision in order to decr caregiver burden.    Time  9    Period  Weeks    Status  New      PT LONG TERM GOAL #4   Title  Pt will tolerate standing activities at RW for at least 6 minutes in order to improve tolerance for ADLs.    Time  9    Period  Weeks    Status  New            Plan - 02/08/20 1809    Clinical Impression Statement  Pt able to stand a max of 1  minute and 30 seconds at the countertop today - with HR and O2 WFL afterwards. However, remaining reps pt only able to stand for 30-45 seconds. Had pt try the SciFit for the first time today with BLE and BUE, after 4:30 pt's O2 sats at 92%, needed cues for pursed lip breathing with sats increasing back up to 96%. Pt continues to need verbal and demonstrative cues for proper technique for squat pivot transfers. Will continue to progress towards LTGs.    Personal Factors and Comorbidities  Age;Past/Current Experience    Examination-Activity Limitations  Bed Mobility;Stand;Transfers;Locomotion Level;Hygiene/Grooming;Bathing;Toileting    Examination-Participation Restrictions  Community Activity;Cleaning;Meal Prep    Stability/Clinical Decision Making  Evolving/Moderate complexity    Rehab Potential  Fair    PT Frequency  2x / week    PT Duration  --   7 weeks, plus 1x week for 1 week for 15 visits total   PT Treatment/Interventions  Gait training;Therapeutic exercise;Neuromuscular re-education    PT Next Visit Plan  did they bring in doctor's note? core/trunk/postural strengthening. supine/hooklying BLE strengthening/PROM, SciFit in w/c, transfers from w/c <> mat table, sit <> stands/standing at sink    Consulted and Agree with Plan of Care  Patient;Family member/caregiver    Family Member Consulted  pt's son Jeneen Rinks       Patient will benefit from skilled therapeutic intervention in order to improve the following deficits and impairments:  Decreased balance, Decreased activity tolerance, Decreased coordination, Decreased knowledge of use of DME, Decreased mobility, Decreased range of motion, Difficulty walking, Decreased strength, Impaired flexibility, Postural dysfunction, Impaired sensation, Pain  Visit Diagnosis: Abnormal posture  Muscle weakness (generalized)  Unsteadiness on feet  Other symptoms and signs involving the nervous system     Problem List There are no problems to display  for this patient.   Arliss Journey, PT, DPT  02/08/2020, 6:17 PM  [facility] [address] [place], [place], [address] Phone: [phone]   Fax:  [phone]  Name: Wayne Dudley MRN: [id number] Date of Birth: 07-15-1923

## 2020-02-12 ENCOUNTER — Other Ambulatory Visit: Payer: Self-pay

## 2020-02-12 ENCOUNTER — Ambulatory Visit: Payer: No Typology Code available for payment source | Admitting: Physical Therapy

## 2020-02-12 DIAGNOSIS — R2681 Unsteadiness on feet: Secondary | ICD-10-CM

## 2020-02-12 DIAGNOSIS — R29818 Other symptoms and signs involving the nervous system: Secondary | ICD-10-CM

## 2020-02-12 DIAGNOSIS — M6281 Muscle weakness (generalized): Secondary | ICD-10-CM

## 2020-02-12 DIAGNOSIS — R293 Abnormal posture: Secondary | ICD-10-CM

## 2020-02-12 NOTE — Therapy (Signed)
[facility] [address] [place], [place], [address] Phone: [phone]   Fax:  [phone]  Physical Therapy Treatment  Patient Details  Name: Wayne Dudley MRN: [id number] Date of Birth: 1923/06/25 Referring Provider (PT): Carrington Clamp, MD   Encounter Date: 02/12/2020  PT End of Session - 02/12/20 1505    Visit Number  7    Number of Visits  16   plus 15 visits from [place]   Date for PT Re-Evaluation  03/10/20    Authorization Type  VA - 15 authorized visits in 120 days (use by 05/08/20)    PT Start Time  1102    PT Stop Time  1144    PT Time Calculation (min)  42 min    Equipment Utilized During Treatment  Gait belt    Activity Tolerance  Patient tolerated treatment well    Behavior During Therapy  [facility] for tasks assessed/performed       No past medical history on file.  No past surgical history on file.  There were no vitals filed for this visit.  Subjective Assessment - 02/12/20 1104    Subjective  Got a referral for OT from the [place] - waiting for it to come in. States that he feels like his left leg is getting stronger.    Pertinent History  footpain 2/2 to frostbite injury, CKD, hard of hearing.    How long can you stand comfortably?  can stand for about 1-2 minutes before having pain (in the feet and left ankle)    Diagnostic tests  MRI of lumbar spine 11/10/19: multilevel degenerative changes, severe spinal canal stenosis at L3-L4 and L5-S1    Patient Stated Goals  wants to get back to where he can do for himself.    Currently in Pain?  Yes    Pain Score  6     Pain Location  Foot    Pain Orientation  Right;Left    Pain Descriptors / Indicators  Burning    Pain Type  Chronic pain    Pain Relieving Factors  after taking tylenol                       [facility] Adult PT Treatment/Exercise - 02/12/20 0001      Transfers   Transfers  Sit to Stand;Stand to Sit    Sit to Stand  4: Min assist     Sit to Stand Details  Verbal cues for technique    Stand to Sit  4: Min assist    Stand to Sit Details (indicate cue type and reason)  Verbal cues for technique    Stand to Sit Details  pt with poor eccentric control when sitting back to chair after standing     Squat Pivot Transfers  4: Min guard    Squat Pivot Transfer Details (indicate cue type and reason)  w/c <> mat table, continues to need cues for head/hips relationship prior to transfer and to scoot towards edge of chair    Comments  Standing at sink with w/c posteriorly: 3 x 45 second reps, pillow placed anteriorly to knees due to pt demonstrating incr knee flexion L>R and trunk flexion as time goes on. Needs cues for posture to avoid the cabinets. Cues for glute activation       Exercises   Exercises  Other Exercises    Other Exercises   Modified sit ups x5 reps, and then with bias  towards R knee and then towards L knee x3 reps B for oblique activation. Sitting at edge of mat, leaning laterally on elbow on wedge for increased weight shifting and then using UE and core to help push back up to sitting, with therapist assisting at contralateral hip for weight shift x3 reps B. Taking reclined rest breaks intermittently due to fatigue       Knee/Hip Exercises: Aerobic   Other Aerobic  SciFit at level 1.0 for 5 minutes with BLE and BUE for strengthening, ROM, circulation, and activity tolerance . Wooden blocks placed posteriorly to w/c wheels to prevent tipping.  O2 sats at 98% afterwards               PT Short Term Goals - 01/10/20 2023      PT SHORT TERM GOAL #1   Title  Pt and pt's son/daughter will be independent in initial HEP for LE strengthening/ROM. ALL STGS DUE 02/14/20    Time  5   due to delay in scheduling   Period  Weeks    Status  New    Target Date  02/14/20   due to delay in scheduling     PT SHORT TERM GOAL #2   Title  Pt will perform sit <> stand with min guard with RW in order to improve functional LE  strength.    Baseline  min A for sit <> stand, pt needing BUE on RW    Time  5    Period  Weeks    Status  New      PT SHORT TERM GOAL #3   Title  Pt and pt's son/daughter will verbalize performing pressure relief at home in order to decr risk of pressure sores due to pt spending incr time in w/c and lying in bed.    Time  5    Period  Weeks    Status  New      PT SHORT TERM GOAL #4   Title  Pt will be able to stand for at least 3 minutes at RW with min guard in order to improve tolerance for ADLs.    Time  5    Period  Weeks    Status  New        PT Long Term Goals - 01/10/20 2026      PT LONG TERM GOAL #1   Title  Pt and pt's son/daughter will be independent in final HEP for LE strengthening/ROM. ALL LTGS DUE 03/13/20    Time  9   due to delay in scheduling   Period  Weeks    Status  New    Target Date  03/13/20      PT LONG TERM GOAL #2   Title  Pt will be able to perform squat pivot vs. stand pivot transfers with RW from w/c to mat table with supervision in order to decr caregiver burden.    Time  9    Period  Weeks    Status  New      PT LONG TERM GOAL #3   Title  Pt will perform sit <> stand from w/c and mat table with RW with supervision in order to decr caregiver burden.    Time  9    Period  Weeks    Status  New      PT LONG TERM GOAL #4   Title  Pt will tolerate standing activities at RW for at least 6 minutes in  order to improve tolerance for ADLs.    Time  9    Period  Weeks    Status  New            Plan - 02/12/20 1512    Clinical Impression Statement  Focus of today's skilled session was standing tolerance, core strengthening, and activity tolerance. Pt challenged by seated core strengthening exercises, especially laterally and needed frequent intermittent rest breaks due to fatigue. Pt able to perform the SciFit today for 5 minutes with O2 sats remaining at 98% afterwards. Pt only able to tolerate standing at countertop for 45 seconds today for  3 bouts, continues with incr forward flexed posture as time goes on. Will continue to progress towards LTGs.    Personal Factors and Comorbidities  Age;Past/Current Experience    Examination-Activity Limitations  Bed Mobility;Stand;Transfers;Locomotion Level;Hygiene/Grooming;Bathing;Toileting    Examination-Participation Restrictions  Community Activity;Cleaning;Meal Prep    Stability/Clinical Decision Making  Evolving/Moderate complexity    Rehab Potential  Fair    PT Frequency  2x / week    PT Duration  --   7 weeks, plus 1x week for 1 week for 15 visits total   PT Treatment/Interventions  Gait training;Therapeutic exercise;Neuromuscular re-education    PT Next Visit Plan  check STGs.  core/trunk/postural strengthening. supine/hooklying BLE strengthening/PROM, SciFit in w/c, transfers from w/c <> mat table, sit <> stands/standing at sink    Consulted and Agree with Plan of Care  Patient;Family member/caregiver    Family Member Consulted  pt's son Jeneen Rinks       Patient will benefit from skilled therapeutic intervention in order to improve the following deficits and impairments:  Decreased balance, Decreased activity tolerance, Decreased coordination, Decreased knowledge of use of DME, Decreased mobility, Decreased range of motion, Difficulty walking, Decreased strength, Impaired flexibility, Postural dysfunction, Impaired sensation, Pain  Visit Diagnosis: Muscle weakness (generalized)  Unsteadiness on feet  Abnormal posture  Other symptoms and signs involving the nervous system     Problem List There are no problems to display for this patient.   Arliss Journey, PT, DPT  02/12/2020, 3:14 PM  [facility] [address] [place], [place], [address] Phone: [phone]   Fax:  [phone]  Name: Wayne Dudley MRN: [id number] Date of Birth: 10/19/1923

## 2020-02-14 ENCOUNTER — Ambulatory Visit: Payer: No Typology Code available for payment source

## 2020-02-21 ENCOUNTER — Ambulatory Visit: Payer: No Typology Code available for payment source

## 2020-02-21 ENCOUNTER — Other Ambulatory Visit: Payer: Self-pay

## 2020-02-21 DIAGNOSIS — M6281 Muscle weakness (generalized): Secondary | ICD-10-CM

## 2020-02-21 DIAGNOSIS — R29818 Other symptoms and signs involving the nervous system: Secondary | ICD-10-CM | POA: Diagnosis not present

## 2020-02-21 NOTE — Therapy (Signed)
[facility] [address] [place], [place], [address] Phone: [phone]   Fax:  [phone]  Physical Therapy Treatment  Patient Details  Name: Wayne Dudley MRN: [id number] Date of Birth: 22-Sep-1923 Referring Provider (PT): [name], MD   Encounter Date: 02/21/2020  PT End of Session - 02/21/20 1151    Visit Number  8    Number of Visits  16   plus 15 visits from [place]   Date for PT Re-Evaluation  03/10/20    Authorization Type  VA - 15 authorized visits in 120 days (use by 05/08/20)    PT Start Time  1149    PT Stop Time  1230    PT Time Calculation (min)  41 min    Equipment Utilized During Treatment  Gait belt    Activity Tolerance  Patient tolerated treatment well    Behavior During Therapy  [facility] for tasks assessed/performed       No past medical history on file.  No past surgical history on file.  There were no vitals filed for this visit.  Subjective Assessment - 02/21/20 1151    Subjective  Pt reports he has no changes. Son reports they have appointment with an OT at [place] coming up on 4/27 telephonically.    Pertinent History  footpain 2/2 to frostbite injury, CKD, hard of hearing.    How long can you stand comfortably?  can stand for about 1-2 minutes before having pain (in the feet and left ankle)    Diagnostic tests  MRI of lumbar spine 11/10/19: multilevel degenerative changes, severe spinal canal stenosis at L3-L4 and L5-S1    Patient Stated Goals  wants to get back to where he can do for himself.    Currently in Pain?  Yes    Pain Score  5     Pain Location  Foot    Pain Orientation  Right;Left    Pain Descriptors / Indicators  Burning                       [facility] Adult PT Treatment/Exercise - 02/21/20 1225      Bed Mobility   Bed Mobility  Supine to Sit;Sit to Supine    Supine to Sit  Minimal Assistance - Patient > 75%   at legs   Sit to Supine  Minimal Assistance -  Patient > 75%      Transfers   Transfers  Squat Pivot Transfers    Squat Pivot Transfers  4: Min guard    Squat Pivot Transfer Details (indicate cue type and reason)  w/c to/from mat      Self-Care   Self-Care  Other Self-Care Comments    Other Self-Care Comments   Pt reviewed pressure relief in chair every 20 min for 2 min with using lateral lean. Explained importance of getting blood flow to bottom to prevent sores.      Exercises   Exercises  Other Exercises    Other Exercises   Pt performed seated edge of mat with wedge with pillows behind him: modified crunch x 5 back towards wedge just shy of it and then up CGA, down on elbow to side and pushing back up x 3 each side. Pt had to rest between sides. Reported a little discomfort initially in left side. Repeated with reaching for 3 cones with opposite arm when he came down on elbow and placed on opposite side x 3 each  side. Supine on wedge: heel slides x 10 with CGA with flexion more on left and then resisted extension back down through foot. Pt able to move more with right LE and provided more resistance. Supine hip abd/adduction with PT supporting under leg x 10 each side with min assist to abduct. Glut sets/partial bridge over bolster 5 x 2 with minimal lift but more activation noted in gluts and quads. Son present and observing throughout. Added glut sets over bolster/pillow to HEP and instructed son to resist knee/hip extension with heel slides.             PT Education - 02/21/20 1230    Education Details  Pt and son instructed in some changes to HEP with glut sets over bolster/pillow under knees to try to get a little more activation as with heel slides having some resist with extension.    Person(s) Educated  Patient;Child(ren)    Methods  Explanation;Demonstration    Comprehension  Verbalized understanding       PT Short Term Goals - 02/21/20 1258      PT SHORT TERM GOAL #1   Title  Pt and pt's son/daughter will be  independent in initial HEP for LE strengthening/ROM. ALL STGS DUE 02/14/20    Baseline  Pt and son have been performing intial HEP    Time  5   due to delay in scheduling   Period  Weeks    Status  Achieved    Target Date  02/14/20   due to delay in scheduling     PT SHORT TERM GOAL #2   Title  Pt will perform sit <> stand with min guard with RW in order to improve functional LE strength.    Baseline  min A for sit <> stand, pt needing BUE on RW    Time  5    Period  Weeks    Status  Not Met      PT SHORT TERM GOAL #3   Title  Pt and pt's son/daughter will verbalize performing pressure relief at home in order to decr risk of pressure sores due to pt spending incr time in w/c and lying in bed.    Baseline  PT reviewed pressure relief in sitting. Pt reports not performing as often as he should    Time  5    Period  Weeks    Status  Partially Met      PT SHORT TERM GOAL #4   Title  Pt will be able to stand for at least 3 minutes at RW with min guard in order to improve tolerance for ADLs.    Baseline  45 sec at sink    Time  5    Period  Weeks    Status  Not Met        PT Long Term Goals - 01/10/20 2026      PT LONG TERM GOAL #1   Title  Pt and pt's son/daughter will be independent in final HEP for LE strengthening/ROM. ALL LTGS DUE 03/13/20    Time  9   due to delay in scheduling   Period  Weeks    Status  New    Target Date  03/13/20      PT LONG TERM GOAL #2   Title  Pt will be able to perform squat pivot vs. stand pivot transfers with RW from w/c to mat table with supervision in order to decr caregiver burden.  Time  9    Period  Weeks    Status  New      PT LONG TERM GOAL #3   Title  Pt will perform sit <> stand from w/c and mat table with RW with supervision in order to decr caregiver burden.    Time  9    Period  Weeks    Status  New      PT LONG TERM GOAL #4   Title  Pt will tolerate standing activities at RW for at least 6 minutes in order to improve  tolerance for ADLs.    Time  9    Period  Weeks    Status  New            Plan - 02/21/20 1306    Clinical Impression Statement  Pt was able to show more activation in LE especially with extension today on right with PT providing resistance. Added this to HEP for son to perform. Pt continues to work on standing at home with son as well and son reports 25 sec is max he is usually able to do 3 times. Pt has been performing initial HEP with son. Doing well with squat-pivot transfer CGA but still needs min assist with sit to stand at sink. Pt continues to benefit from skilled PT to continue to work on strength, and improving functional mobility.    Personal Factors and Comorbidities  Age;Past/Current Experience    Examination-Activity Limitations  Bed Mobility;Stand;Transfers;Locomotion Level;Hygiene/Grooming;Bathing;Toileting    Examination-Participation Restrictions  Community Activity;Cleaning;Meal Prep    Stability/Clinical Decision Making  Evolving/Moderate complexity    Rehab Potential  Fair    PT Frequency  2x / week    PT Duration  --   7 weeks, plus 1x week for 1 week for 15 visits total   PT Treatment/Interventions  Gait training;Therapeutic exercise;Neuromuscular re-education    PT Next Visit Plan  core/trunk/postural strengthening. supine/hooklying BLE strengthening/PROM, SciFit in w/c, transfers from w/c <> mat table, sit <> stands/standing at sink    Consulted and Agree with Plan of Care  Patient;Family member/caregiver    Family Member Consulted  pt's son Jeneen Rinks       Patient will benefit from skilled therapeutic intervention in order to improve the following deficits and impairments:  Decreased balance, Decreased activity tolerance, Decreased coordination, Decreased knowledge of use of DME, Decreased mobility, Decreased range of motion, Difficulty walking, Decreased strength, Impaired flexibility, Postural dysfunction, Impaired sensation, Pain  Visit Diagnosis: Muscle  weakness (generalized)     Problem List There are no problems to display for this patient.   Electa Sniff, PT, DPT, NCS 02/21/2020, 1:09 PM  [facility] [address] [place], [place], [address] Phone: [phone]   Fax:  [phone]  Name: Wayne Dudley MRN: [id number] Date of Birth: [birth year]

## 2020-02-22 ENCOUNTER — Ambulatory Visit: Payer: No Typology Code available for payment source | Admitting: Physical Therapy

## 2020-02-22 DIAGNOSIS — R29818 Other symptoms and signs involving the nervous system: Secondary | ICD-10-CM | POA: Diagnosis not present

## 2020-02-22 DIAGNOSIS — R2681 Unsteadiness on feet: Secondary | ICD-10-CM

## 2020-02-22 DIAGNOSIS — R293 Abnormal posture: Secondary | ICD-10-CM

## 2020-02-22 DIAGNOSIS — M6281 Muscle weakness (generalized): Secondary | ICD-10-CM

## 2020-02-22 NOTE — Therapy (Signed)
[facility] [address] [place], [place], [address] Phone: [phone]   Fax:  [phone]  Physical Therapy Treatment  Patient Details  Name: Wayne Dudley MRN: [id number] Date of Birth: [birth year] Referring Provider (PT): [name], MD   Encounter Date: 02/22/2020  PT End of Session - 02/22/20 1158    Visit Number  9    Number of Visits  16   plus 15 visits from [place]   Date for PT Re-Evaluation  03/10/20    Authorization Type  VA - 15 authorized visits in 120 days (use by 05/08/20)    PT Start Time  1103    PT Stop Time  1147    PT Time Calculation (min)  44 min    Equipment Utilized During Treatment  Gait belt    Activity Tolerance  Patient tolerated treatment well    Behavior During Therapy  [facility] for tasks assessed/performed       No past medical history on file.  No past surgical history on file.  There were no vitals filed for this visit.  Subjective Assessment - 02/22/20 1106    Subjective  Standing at home is going very slow.    Pertinent History  footpain 2/2 to frostbite injury, CKD, hard of hearing.    How long can you stand comfortably?  can stand for about 1-2 minutes before having pain (in the feet and left ankle)    Diagnostic tests  MRI of lumbar spine 11/10/19: multilevel degenerative changes, severe spinal canal stenosis at L3-L4 and L5-S1    Patient Stated Goals  wants to get back to where he can do for himself.                       [facility] Adult PT Treatment/Exercise - 02/22/20 0001      Transfers   Transfers  Squat Pivot Transfers    Squat Pivot Transfers  4: Min guard    Squat Pivot Transfer Details (indicate cue type and reason)  w/c <> mat table, with initial verbal cues for foot placement and scooting towards edge before transferring     Comments  At edge of mat table: pushing through BUE and BLE to lift buttocks off of mat with holding 5 seconds for additional  weight bearing through BLE, 2 x 5 reps      Exercises   Exercises  Other Exercises    Other Exercises   Seated at edge of mat: 2 x 7 reps LAQs on RLE with visual and verbal cues for pt to "kick" up towards therapist's hand. Supine on mat table with bolster under BLE: 2 x 5 reps glute sets with verbal and tactile cues for glute activation - reviewed as it was previously added to pt's HEP at prior session. 2 x 6 reps SAQs on LLE (pt unable to perform in full range but able to hold for approx. 3 seconds), rest break between each set. 10 reps hip ADD/hip ABD with PT providing min A and supporting under leg and keeping pt's foot in proper position. 2 x 5 reps B hip flex/extension - therapist needing to provide min A and tactile cues on LLE for hip flexion with cues to perform a "big kick" into therapist's hand B for extension.                PT Short Term Goals - 02/21/20 1258      PT SHORT TERM GOAL #  1   Title  Pt and pt's son/daughter will be independent in initial HEP for LE strengthening/ROM. ALL STGS DUE 02/14/20    Baseline  Pt and son have been performing intial HEP    Time  5   due to delay in scheduling   Period  Weeks    Status  Achieved    Target Date  02/14/20   due to delay in scheduling     PT SHORT TERM GOAL #2   Title  Pt will perform sit <> stand with min guard with RW in order to improve functional LE strength.    Baseline  min A for sit <> stand, pt needing BUE on RW    Time  5    Period  Weeks    Status  Not Met      PT SHORT TERM GOAL #3   Title  Pt and pt's son/daughter will verbalize performing pressure relief at home in order to decr risk of pressure sores due to pt spending incr time in w/c and lying in bed.    Baseline  PT reviewed pressure relief in sitting. Pt reports not performing as often as he should    Time  5    Period  Weeks    Status  Partially Met      PT SHORT TERM GOAL #4   Title  Pt will be able to stand for at least 3 minutes at RW with  min guard in order to improve tolerance for ADLs.    Baseline  45 sec at sink    Time  5    Period  Weeks    Status  Not Met        PT Long Term Goals - 01/10/20 2026      PT LONG TERM GOAL #1   Title  Pt and pt's son/daughter will be independent in final HEP for LE strengthening/ROM. ALL LTGS DUE 03/13/20    Time  9   due to delay in scheduling   Period  Weeks    Status  New    Target Date  03/13/20      PT LONG TERM GOAL #2   Title  Pt will be able to perform squat pivot vs. stand pivot transfers with RW from w/c to mat table with supervision in order to decr caregiver burden.    Time  9    Period  Weeks    Status  New      PT LONG TERM GOAL #3   Title  Pt will perform sit <> stand from w/c and mat table with RW with supervision in order to decr caregiver burden.    Time  9    Period  Weeks    Status  New      PT LONG TERM GOAL #4   Title  Pt will tolerate standing activities at RW for at least 6 minutes in order to improve tolerance for ADLs.    Time  9    Period  Weeks    Status  New            Plan - 02/22/20 1159    Clinical Impression Statement  Pt improving with squat pivot transfers to and from mat table, however does need some verbal cues at times for foot placement and safety. Pt able to perform SAQs on LLE today, not in full range however, but was able to do so for 5-6 reps (previously could only perform  from [facility] from therapist). Will continue to progress towards LTGs.    Personal Factors and Comorbidities  Age;Past/Current Experience    Examination-Activity Limitations  Bed Mobility;Stand;Transfers;Locomotion Level;Hygiene/Grooming;Bathing;Toileting    Examination-Participation Restrictions  Community Activity;Cleaning;Meal Prep    Stability/Clinical Decision Making  Evolving/Moderate complexity    Rehab Potential  Fair    PT Frequency  2x / week    PT Duration  --   7 weeks, plus 1x week for 1 week for 15 visits total   PT Treatment/Interventions   Gait training;Therapeutic exercise;Neuromuscular re-education    PT Next Visit Plan  core/trunk/postural strengthening. supine/hooklying BLE strengthening/PROM, SciFit in w/c, transfers from w/c <> mat table, sit <> stands/standing at sink    Consulted and Agree with Plan of Care  Patient;Family member/caregiver    Family Member Consulted  pt's son Jeneen Rinks       Patient will benefit from skilled therapeutic intervention in order to improve the following deficits and impairments:  Decreased balance, Decreased activity tolerance, Decreased coordination, Decreased knowledge of use of DME, Decreased mobility, Decreased range of motion, Difficulty walking, Decreased strength, Impaired flexibility, Postural dysfunction, Impaired sensation, Pain  Visit Diagnosis: Muscle weakness (generalized)  Unsteadiness on feet  Abnormal posture  Other symptoms and signs involving the nervous system     Problem List There are no problems to display for this patient.   Arliss Journey, PT, DPT  02/22/2020, 12:06 PM  [facility] [address] [place], [place], [address] Phone: [phone]   Fax:  [phone]  Name: Wayne Dudley MRN: [id number] Date of Birth: 12-Dec-1922

## 2020-02-25 ENCOUNTER — Other Ambulatory Visit: Payer: Self-pay

## 2020-02-25 ENCOUNTER — Ambulatory Visit: Payer: No Typology Code available for payment source | Admitting: Physical Therapy

## 2020-02-25 DIAGNOSIS — R293 Abnormal posture: Secondary | ICD-10-CM

## 2020-02-25 DIAGNOSIS — M6281 Muscle weakness (generalized): Secondary | ICD-10-CM

## 2020-02-25 DIAGNOSIS — R29818 Other symptoms and signs involving the nervous system: Secondary | ICD-10-CM | POA: Diagnosis not present

## 2020-02-25 DIAGNOSIS — R2681 Unsteadiness on feet: Secondary | ICD-10-CM

## 2020-02-25 NOTE — Therapy (Signed)
[facility] [address] [place], [place], [address] Phone: [phone]   Fax:  [phone]  Physical Therapy Treatment  Patient Details  Name: Wayne Dudley MRN: [id number] Date of Birth: 05/03/1923 Referring Provider (PT): [name], MD   Encounter Date: 02/25/2020  PT End of Session - 02/25/20 2117    Visit Number  10    Number of Visits  16   plus 15 visits from [place]   Date for PT Re-Evaluation  03/10/20    Authorization Type  VA - 15 authorized visits in 120 days (use by 05/08/20)    PT Start Time  1103    PT Stop Time  1145    PT Time Calculation (min)  42 min    Equipment Utilized During Treatment  Gait belt    Activity Tolerance  Patient tolerated treatment well    Behavior During Therapy  [facility] for tasks assessed/performed       No past medical history on file.  No past surgical history on file.  There were no vitals filed for this visit.  Subjective Assessment - 02/25/20 1106    Subjective  Can now kick out with his left leg!    Pertinent History  footpain 2/2 to frostbite injury, CKD, hard of hearing.    How long can you stand comfortably?  can stand for about 1-2 minutes before having pain (in the feet and left ankle)    Diagnostic tests  MRI of lumbar spine 11/10/19: multilevel degenerative changes, severe spinal canal stenosis at L3-L4 and L5-S1    Patient Stated Goals  wants to get back to where he can do for himself.    Currently in Pain?  Yes    Pain Score  6     Pain Location  Foot    Pain Orientation  Right;Left    Pain Descriptors / Indicators  Burning                       [facility] Adult PT Treatment/Exercise - 02/25/20 0001      Bed Mobility   Sit to Supine  Minimal Assistance - Patient > 75%   for BLEs     Transfers   Transfers  Squat Pivot Transfers    Squat Pivot Transfers  4: Min guard    Squat Pivot Transfer Details (indicate cue type and reason)  w/c <> mat  table    Comments  from elevated mat table (25") sit <> stands with RW with therapist providing min A for balance and at pt's L knee (due to incr knee flexion with incr standing) performed 5 reps of approx. 45 seconds - 1 minute each, needed cues for upright posture with glute/quad activation as well as relaxing B shoulders      Exercises   Exercises  Other Exercises    Other Exercises   Seated in w/c: LAQs on LLE (in pt's available range) 2 x 5 reps, 1 x 10 reps on RLE (with pt holding at end range for approx. seconds before lowering) - added to HEP. In sidelying on mat: hip flexion stretch 4 x 30 second reps B, then progressing to AAROM x10 reps hip flexion and hip extension (with therapist assisting more into extension) - verbal and tactile cues for technique). Pt rolled onto stomach in transitioning from sidelying on R> L. In prone position for approx. 1-2 minutes for hip flexion stretch, pt on elbows and performed scap retraction/protraction  x10 reps with verbal and tactile cues for technique. In supine: hamstring stretch 2 x 30 seconds B.  With bed mobility from middle of mat table > edge, therapist assisting at pt's hips for scooting.             PT Education - 02/25/20 2117    Education Details  verbally added LAQs to HEP    Person(s) Educated  Patient;Child(ren)    Methods  Explanation;Demonstration    Comprehension  Verbalized understanding;Returned demonstration       PT Short Term Goals - 02/21/20 1258      PT SHORT TERM GOAL #1   Title  Pt and pt's son/daughter will be independent in initial HEP for LE strengthening/ROM. ALL STGS DUE 02/14/20    Baseline  Pt and son have been performing intial HEP    Time  5   due to delay in scheduling   Period  Weeks    Status  Achieved    Target Date  02/14/20   due to delay in scheduling     PT SHORT TERM GOAL #2   Title  Pt will perform sit <> stand with min guard with RW in order to improve functional LE strength.    Baseline   min A for sit <> stand, pt needing BUE on RW    Time  5    Period  Weeks    Status  Not Met      PT SHORT TERM GOAL #3   Title  Pt and pt's son/daughter will verbalize performing pressure relief at home in order to decr risk of pressure sores due to pt spending incr time in w/c and lying in bed.    Baseline  PT reviewed pressure relief in sitting. Pt reports not performing as often as he should    Time  5    Period  Weeks    Status  Partially Met      PT SHORT TERM GOAL #4   Title  Pt will be able to stand for at least 3 minutes at RW with min guard in order to improve tolerance for ADLs.    Baseline  45 sec at sink    Time  5    Period  Weeks    Status  Not Met        PT Long Term Goals - 01/10/20 2026      PT LONG TERM GOAL #1   Title  Pt and pt's son/daughter will be independent in final HEP for LE strengthening/ROM. ALL LTGS DUE 03/13/20    Time  9   due to delay in scheduling   Period  Weeks    Status  New    Target Date  03/13/20      PT LONG TERM GOAL #2   Title  Pt will be able to perform squat pivot vs. stand pivot transfers with RW from w/c to mat table with supervision in order to decr caregiver burden.    Time  9    Period  Weeks    Status  New      PT LONG TERM GOAL #3   Title  Pt will perform sit <> stand from w/c and mat table with RW with supervision in order to decr caregiver burden.    Time  9    Period  Weeks    Status  New      PT LONG TERM GOAL #4   Title  Pt will  tolerate standing activities at [place] for at least 6 minutes in order to improve tolerance for ADLs.    Time  9    Period  Weeks    Status  New            Plan - 02/25/20 2119    Clinical Impression Statement  Pt able to perform LAQs sitting in wheelchair with LLE today (not in full range though and would fatigue after a couple of reps) - verbally added to HEP. Pt able to perform sit <> stands from elevated mat table today, demonstrating more upright posture, therapist providing min  A, especially at L knee due to it having increased knee flexion with prolonged standing. Pt able to tolerate prone today for 1-2 minutes for scapular strengthening and anterior full body stretch (pt's son states he hasn't been on his stomach in a couple of months). Will continue to progress towards LTGs.    Personal Factors and Comorbidities  Age;Past/Current Experience    Examination-Activity Limitations  Bed Mobility;Stand;Transfers;Locomotion Level;Hygiene/Grooming;Bathing;Toileting    Examination-Participation Restrictions  Community Activity;Cleaning;Meal Prep    Stability/Clinical Decision Making  Evolving/Moderate complexity    Rehab Potential  Fair    PT Frequency  2x / week    PT Duration  --   7 weeks, plus 1x week for 1 week for 15 visits total   PT Treatment/Interventions  Gait training;Therapeutic exercise;Neuromuscular re-education    PT Next Visit Plan  sit <> stands from elevated mat table with RW, potentially try taking steps in // bars? core/trunk/postural strengthening. supine/hooklying BLE strengthening/PROM, SciFit in w/c    Consulted and Agree with Plan of Care  Patient;Family member/caregiver    Family Member Consulted  pt's son Jeneen Rinks       Patient will benefit from skilled therapeutic intervention in order to improve the following deficits and impairments:  Decreased balance, Decreased activity tolerance, Decreased coordination, Decreased knowledge of use of DME, Decreased mobility, Decreased range of motion, Difficulty walking, Decreased strength, Impaired flexibility, Postural dysfunction, Impaired sensation, Pain  Visit Diagnosis: Muscle weakness (generalized)  Unsteadiness on feet  Abnormal posture  Other symptoms and signs involving the nervous system     Problem List There are no problems to display for this patient.   Arliss Journey, PT, DPT  02/25/2020, 9:29 PM  [facility] [address]  [place], [place], [address] Phone: [phone]   Fax:  [phone]  Name: Wayne Dudley MRN: [id number] Date of Birth: [birth year]

## 2020-02-27 ENCOUNTER — Other Ambulatory Visit: Payer: Self-pay

## 2020-02-27 ENCOUNTER — Ambulatory Visit: Payer: No Typology Code available for payment source | Admitting: Physical Therapy

## 2020-02-27 DIAGNOSIS — R2681 Unsteadiness on feet: Secondary | ICD-10-CM

## 2020-02-27 DIAGNOSIS — R29818 Other symptoms and signs involving the nervous system: Secondary | ICD-10-CM | POA: Diagnosis not present

## 2020-02-27 DIAGNOSIS — M6281 Muscle weakness (generalized): Secondary | ICD-10-CM

## 2020-02-27 DIAGNOSIS — R293 Abnormal posture: Secondary | ICD-10-CM

## 2020-02-27 NOTE — Therapy (Signed)
[facility] [address] [place], [place], [address] Phone: [phone]   Fax:  [phone]  Physical Therapy Treatment  Patient Details  Name: Wayne Dudley MRN: [id number] Date of Birth: April 24, 1923 Referring Provider (PT): [name], MD   Encounter Date: 02/27/2020  PT End of Session - 02/27/20 2032    Visit Number  11    Number of Visits  16   plus 15 visits from [place]   Date for PT Re-Evaluation  03/10/20    Authorization Type  VA - 15 authorized visits in 120 days (use by 05/08/20)    PT Start Time  7014    PT Stop Time  1316    PT Time Calculation (min)  41 min    Equipment Utilized During Treatment  Gait belt    Activity Tolerance  Patient tolerated treatment well    Behavior During Therapy  [facility] for tasks assessed/performed       No past medical history on file.  No past surgical history on file.  There were no vitals filed for this visit.  Subjective Assessment - 02/27/20 1239    Subjective  Had an over the phone appointment with an OT from the [place] - is going to have a referral sent over here for OT.    Pertinent History  footpain 2/2 to frostbite injury, CKD, hard of hearing.    How long can you stand comfortably?  can stand for about 1-2 minutes before having pain (in the feet and left ankle)    Diagnostic tests  MRI of lumbar spine 11/10/19: multilevel degenerative changes, severe spinal canal stenosis at L3-L4 and L5-S1    Patient Stated Goals  wants to get back to where he can do for himself.    Currently in Pain?  Yes    Pain Score  6     Pain Location  Foot   L foot is the worst   Pain Orientation  Right;Left    Pain Descriptors / Indicators  Burning    Pain Type  Chronic pain                       [facility] Adult PT Treatment/Exercise - 02/27/20 0001      Transfers   Transfers  Squat Pivot Transfers    Sit to Stand  4: Min assist    Sit to Stand Details (indicate cue type  and reason)  from w/c to // bars    Stand to Sit  4: Min assist    Stand to Sit Details  from // bars back down to w/c, assist for controlled descent    Comments  static standing in // bars: 2 x 1 minute reps with therapist providing min/mod A anteriorly at knees and pt holding in // bars with PT tech close by, cues for posture by having pt look in mirror as well as quad/glute activation. performed 1 additional rep x45 seconds with M/L weight shifting through hips      Ambulation/Gait   Gait Comments  in // bars with mod A x2 (therapist anteriorly) and tech providing assist and w/c follow, attempted taking a couple of steps (approx. 4 steps) before pt needing to sit down, therapist blocking pt's knees as L with incr weakness and more tendency to buckle. Pt with decr awareness of foot placement. increased forward flexed posture and increased time with weight shift. O2 97% and HR 81 bpm afterwards  Therapeutic Activites    Therapeutic Activities  Other Therapeutic Activities    Other Therapeutic Activities  therapist answered questions from pt's son regarding OT (pt had a phone call the other day with OT from the [place] and a referral should be sent to this location)      Exercises   Exercises  Other Exercises    Other Exercises   reviewed paratial bridging/glute set over bolster exercise added to HEP: 2 x 5 reps       Knee/Hip Exercises: Aerobic   Other Aerobic  SciFit at level 1.0 for 6 minutes with BLE and BUE for strengthening, ROM, and circulation. Wooden  blocks placed posteriorly to w/c wheels to prevent tipping. 2 bouts of 20 seconds with legs only, therapist needing to asisst holding w/c to maintain steady               PT Short Term Goals - 02/21/20 1258      PT SHORT TERM GOAL #1   Title  Pt and pt's son/daughter will be independent in initial HEP for LE strengthening/ROM. ALL STGS DUE 02/14/20    Baseline  Pt and son have been performing intial HEP    Time  5   due to  delay in scheduling   Period  Weeks    Status  Achieved    Target Date  02/14/20   due to delay in scheduling     PT SHORT TERM GOAL #2   Title  Pt will perform sit <> stand with min guard with RW in order to improve functional LE strength.    Baseline  min A for sit <> stand, pt needing BUE on RW    Time  5    Period  Weeks    Status  Not Met      PT SHORT TERM GOAL #3   Title  Pt and pt's son/daughter will verbalize performing pressure relief at home in order to decr risk of pressure sores due to pt spending incr time in w/c and lying in bed.    Baseline  PT reviewed pressure relief in sitting. Pt reports not performing as often as he should    Time  5    Period  Weeks    Status  Partially Met      PT SHORT TERM GOAL #4   Title  Pt will be able to stand for at least 3 minutes at RW with min guard in order to improve tolerance for ADLs.    Baseline  45 sec at sink    Time  5    Period  Weeks    Status  Not Met        PT Long Term Goals - 01/10/20 2026      PT LONG TERM GOAL #1   Title  Pt and pt's son/daughter will be independent in final HEP for LE strengthening/ROM. ALL LTGS DUE 03/13/20    Time  9   due to delay in scheduling   Period  Weeks    Status  New    Target Date  03/13/20      PT LONG TERM GOAL #2   Title  Pt will be able to perform squat pivot vs. stand pivot transfers with RW from w/c to mat table with supervision in order to decr caregiver burden.    Time  9    Period  Weeks    Status  New      PT LONG  TERM GOAL #3   Title  Pt will perform sit <> stand from w/c and mat table with RW with supervision in order to decr caregiver burden.    Time  9    Period  Weeks    Status  New      PT LONG TERM GOAL #4   Title  Pt will tolerate standing activities at RW for at least 6 minutes in order to improve tolerance for ADLs.    Time  9    Period  Weeks    Status  New            Plan - 02/27/20 2041    Clinical Impression Statement  Pt able to  tolerate static standing in // bars today with BUE support for 1 minute bouts with therapist providing min/mod A and to block L knee, due to incr knee flexion as time went on with standing, however pt did show increased activation of L quads at times with verbal and tactile cues. Attempted to take a couple steps today with mod A x2 with pt having decr weight shift and decr awareness of foot placement and L quad weakness in stance. Also with incr forward flexed posture. Will continue to progress towards LTGs.    Personal Factors and Comorbidities  Age;Past/Current Experience    Examination-Activity Limitations  Bed Mobility;Stand;Transfers;Locomotion Level;Hygiene/Grooming;Bathing;Toileting    Examination-Participation Restrictions  Community Activity;Cleaning;Meal Prep    Stability/Clinical Decision Making  Evolving/Moderate complexity    Rehab Potential  Fair    PT Frequency  2x / week    PT Duration  --   7 weeks, plus 1x week for 1 week for 15 visits total   PT Treatment/Interventions  Gait training;Therapeutic exercise;Neuromuscular re-education    PT Next Visit Plan  did OT order come in? sit <> stands from elevated mat table with RW, core/trunk/postural strengthening. supine/hooklying BLE strengthening/PROM, SciFit in w/c    Consulted and Agree with Plan of Care  Patient;Family member/caregiver    Family Member Consulted  pt's son Jeneen Rinks       Patient will benefit from skilled therapeutic intervention in order to improve the following deficits and impairments:  Decreased balance, Decreased activity tolerance, Decreased coordination, Decreased knowledge of use of DME, Decreased mobility, Decreased range of motion, Difficulty walking, Decreased strength, Impaired flexibility, Postural dysfunction, Impaired sensation, Pain  Visit Diagnosis: Muscle weakness (generalized)  Abnormal posture  Unsteadiness on feet  Other symptoms and signs involving the nervous system     Problem  List There are no problems to display for this patient.   Arliss Journey, PT, DPT  02/27/2020, 8:44 PM  [facility] [address] [place], [place], [address] Phone: [phone]   Fax:  [phone]  Name: Wayne Dudley MRN: [id number] Date of Birth: 19-Oct-1923

## 2020-03-05 ENCOUNTER — Other Ambulatory Visit: Payer: Self-pay

## 2020-03-05 ENCOUNTER — Ambulatory Visit: Payer: No Typology Code available for payment source | Attending: Psychiatry

## 2020-03-05 DIAGNOSIS — M6281 Muscle weakness (generalized): Secondary | ICD-10-CM | POA: Insufficient documentation

## 2020-03-05 DIAGNOSIS — R293 Abnormal posture: Secondary | ICD-10-CM | POA: Insufficient documentation

## 2020-03-05 DIAGNOSIS — R2681 Unsteadiness on feet: Secondary | ICD-10-CM | POA: Diagnosis present

## 2020-03-05 DIAGNOSIS — R2689 Other abnormalities of gait and mobility: Secondary | ICD-10-CM | POA: Insufficient documentation

## 2020-03-05 NOTE — Therapy (Signed)
[facility] [address] [place], [place], [address] Phone: [phone]   Fax:  [phone]  Physical Therapy Treatment  Patient Details  Name: Wayne Dudley MRN: [id number] Date of Birth: 08-Feb-1923 Referring Provider (PT): [name], MD   Encounter Date: 03/05/2020  PT End of Session - 03/05/20 1321    Visit Number  12    Number of Visits  16   plus 15 visits from [place]   Date for PT Re-Evaluation  03/10/20    Authorization Type  [facility] - 15 authorized visits in 120 days (use by 05/08/20)    PT Start Time  1319   therapist running a little behind   PT Stop Time  1354    PT Time Calculation (min)  35 min    Equipment Utilized During Treatment  Gait belt    Activity Tolerance  Patient tolerated treatment well    Behavior During Therapy  [facility] for tasks assessed/performed       History reviewed. No pertinent past medical history.  History reviewed. No pertinent surgical history.  There were no vitals filed for this visit.  Subjective Assessment - 03/05/20 1321    Subjective  Pt reports that he is doing well. No new issues.    Pertinent History  footpain 2/2 to frostbite injury, CKD, hard of hearing.    How long can you stand comfortably?  can stand for about 1-2 minutes before having pain (in the feet and left ankle)    Diagnostic tests  MRI of lumbar spine 11/10/19: multilevel degenerative changes, severe spinal canal stenosis at L3-L4 and L5-S1    Patient Stated Goals  wants to get back to where he can do for himself.    Currently in Pain?  Yes    Pain Score  6     Pain Location  Foot    Pain Orientation  Left    Pain Descriptors / Indicators  Burning    Pain Type  Chronic pain                       [facility] Adult PT Treatment/Exercise - 03/05/20 1323      Transfers   Transfers  Sit to Stand;Stand to Sit    Sit to Stand  4: Min guard;4: Min assist    Sit to Stand Details  Verbal cues for  technique    Sit to Stand Details (indicate cue type and reason)  Pt performed pulling on // bars CGA. Was instructed to try pushing with one hand from chair and one on bar and able to perform CGA/min assist at times.    Stand to Sit  4: Min guard    Stand to Sit Details (indicate cue type and reason)  Verbal cues for technique    Stand to Sit Details  from // bars    Comments  Standing in // bars: 2 min 18 sec with tactile cues at knees to straighten. Pt was able to contract quad and stand erect with cuing. Pt reported he did feel like he was pushing through left arm more than right. 2nd bout 2 min 40 sec with weight shifting side to side for first minute with verbal and tactile cues to tighten bottom and quads for more erect posture. Standing in bars: mini-squat x 10 with PT providing tactile and verbal cues to tighten bottom. Standing with stepping forward and back x 5 each leg CGA/min assist for safety to  block knees.       Ambulation/Gait   Ambulation/Gait  Yes    Ambulation/Gait Assistance  3: Mod assist    Ambulation/Gait Assistance Details  PT blocking at knees with stance with a couple bouts of buckling needing min/mod assist to maintain. Pt was given verbal cues for erect posture and to slide hands forward    Ambulation Distance (Feet)  6 Feet    Assistive device  Parallel bars   w/c follow   Gait Pattern  Step-to pattern;Decreased step length - right;Decreased step length - left;Decreased hip/knee flexion - right;Decreased hip/knee flexion - left;Right flexed knee in stance;Left flexed knee in stance    Ambulation Surface  Level;Indoor      Knee/Hip Exercises: Aerobic   Other Aerobic  SciFit x 6 min level 1.0. 20 sec x 2 with LE only with PT stabilizing w/c. Wooden blocks behind wheels as well.             PT Education - 03/05/20 1415    Education Details  Pt to continue with current HEP    Person(s) Educated  Patient;Child(ren)    Methods  Explanation    Comprehension   Verbalized understanding       PT Short Term Goals - 02/21/20 1258      PT SHORT TERM GOAL #1   Title  Pt and pt's son/daughter will be independent in initial HEP for LE strengthening/ROM. ALL STGS DUE 02/14/20    Baseline  Pt and son have been performing intial HEP    Time  5   due to delay in scheduling   Period  Weeks    Status  Achieved    Target Date  02/14/20   due to delay in scheduling     PT SHORT TERM GOAL #2   Title  Pt will perform sit <> stand with min guard with RW in order to improve functional LE strength.    Baseline  min A for sit <> stand, pt needing BUE on RW    Time  5    Period  Weeks    Status  Not Met      PT SHORT TERM GOAL #3   Title  Pt and pt's son/daughter will verbalize performing pressure relief at home in order to decr risk of pressure sores due to pt spending incr time in w/c and lying in bed.    Baseline  PT reviewed pressure relief in sitting. Pt reports not performing as often as he should    Time  5    Period  Weeks    Status  Partially Met      PT SHORT TERM GOAL #4   Title  Pt will be able to stand for at least 3 minutes at RW with min guard in order to improve tolerance for ADLs.    Baseline  45 sec at sink    Time  5    Period  Weeks    Status  Not Met        PT Long Term Goals - 01/10/20 2026      PT LONG TERM GOAL #1   Title  Pt and pt's son/daughter will be independent in final HEP for LE strengthening/ROM. ALL LTGS DUE 03/13/20    Time  9   due to delay in scheduling   Period  Weeks    Status  New    Target Date  03/13/20      PT LONG TERM GOAL #  2   Title  Pt will be able to perform squat pivot vs. stand pivot transfers with RW from w/c to mat table with supervision in order to decr caregiver burden.    Time  9    Period  Weeks    Status  New      PT LONG TERM GOAL #3   Title  Pt will perform sit <> stand from w/c and mat table with RW with supervision in order to decr caregiver burden.    Time  9    Period  Weeks     Status  New      PT LONG TERM GOAL #4   Title  Pt will tolerate standing activities at RW for at least 6 minutes in order to improve tolerance for ADLs.    Time  9    Period  Weeks    Status  New            Plan - 03/05/20 1416    Clinical Impression Statement  Pt was able to increase standing time in // bars today with less assistance. Pt was able to activate quads and gluts more with cuing today. Able to initiate gait more with less assistance.    Personal Factors and Comorbidities  Age;Past/Current Experience    Examination-Activity Limitations  Bed Mobility;Stand;Transfers;Locomotion Level;Hygiene/Grooming;Bathing;Toileting    Examination-Participation Restrictions  Community Activity;Cleaning;Meal Prep    Stability/Clinical Decision Making  Evolving/Moderate complexity    Rehab Potential  Fair    PT Frequency  2x / week    PT Duration  --   7 weeks, plus 1x week for 1 week for 15 visits total   PT Treatment/Interventions  Gait training;Therapeutic exercise;Neuromuscular re-education    PT Next Visit Plan  did OT order come in? sit <> stands from elevated mat table with RW, core/trunk/postural strengthening. supine/hooklying BLE strengthening/PROM, SciFit in w/c, Gait in // bars    Consulted and Agree with Plan of Care  Patient;Family member/caregiver    Family Member Consulted  pt's son Jeneen Rinks       Patient will benefit from skilled therapeutic intervention in order to improve the following deficits and impairments:  Decreased balance, Decreased activity tolerance, Decreased coordination, Decreased knowledge of use of DME, Decreased mobility, Decreased range of motion, Difficulty walking, Decreased strength, Impaired flexibility, Postural dysfunction, Impaired sensation, Pain  Visit Diagnosis: Other abnormalities of gait and mobility  Muscle weakness (generalized)     Problem List There are no problems to display for this patient.   Electa Sniff, PT, DPT,  NCS 03/05/2020, 2:21 PM  [facility] [address] [place], [place], [address] Phone: [phone]   Fax:  [phone]  Name: Wayne Dudley MRN: [id number] Date of Birth: 1923-09-28

## 2020-03-07 ENCOUNTER — Other Ambulatory Visit: Payer: Self-pay

## 2020-03-07 ENCOUNTER — Ambulatory Visit: Payer: No Typology Code available for payment source

## 2020-03-07 DIAGNOSIS — M6281 Muscle weakness (generalized): Secondary | ICD-10-CM

## 2020-03-07 DIAGNOSIS — R2689 Other abnormalities of gait and mobility: Secondary | ICD-10-CM | POA: Diagnosis not present

## 2020-03-07 DIAGNOSIS — R2681 Unsteadiness on feet: Secondary | ICD-10-CM

## 2020-03-07 DIAGNOSIS — R293 Abnormal posture: Secondary | ICD-10-CM

## 2020-03-07 NOTE — Therapy (Signed)
[facility] [address] [place], [place], [address] Phone: [phone]   Fax:  [phone]  Physical Therapy Treatment/Recert  Patient Details  Name: Wayne Dudley MRN: [id number] Date of Birth: [birth year] Referring Provider (PT): [name], MD   Encounter Date: 03/07/2020  PT End of Session - 03/07/20 1056    Visit Number  13    Number of Visits  30   plus 15 visits from [place]   Date for PT Re-Evaluation  33/35/45   90 day cert but 60 day plan of care   Authorization Type  VA - 15 authorized visits in 120 days (use by 05/08/20)    Authorization - Visit Number  13    Authorization - Number of Visits  15    PT Start Time  1050    PT Stop Time  1132    PT Time Calculation (min)  42 min    Equipment Utilized During Treatment  Gait belt    Activity Tolerance  Patient tolerated treatment well    Behavior During Therapy  [facility] for tasks assessed/performed       History reviewed. No pertinent past medical history.  History reviewed. No pertinent surgical history.  There were no vitals filed for this visit.  Subjective Assessment - 03/07/20 1055    Subjective  Pt reports that he has done well with standing with son at home.    Pertinent History  footpain 2/2 to frostbite injury, CKD, hard of hearing.    How long can you stand comfortably?  can stand for about 1-2 minutes before having pain (in the feet and left ankle)    Diagnostic tests  MRI of lumbar spine 11/10/19: multilevel degenerative changes, severe spinal canal stenosis at L3-L4 and L5-S1    Patient Stated Goals  wants to get back to where he can do for himself.    Currently in Pain?  Yes    Pain Score  6     Pain Location  Foot    Pain Orientation  Left         [facility] PT Assessment - 03/07/20 1056      Assessment   Medical Diagnosis  progressive BLE weakness    Referring Provider (PT)  [name], MD    Onset Date/Surgical Date  10/09/19                    [facility] Adult PT Treatment/Exercise - 03/07/20 1056      Transfers   Transfers  Sit to Stand;Stand to [employer] Transfers    Sit to Stand  4: Min assist    Sit to Stand Details  Verbal cues for technique    Sit to Stand Details (indicate cue type and reason)  Pt was cued to bring legs back prior to going to stand. Pt needed to assist left leg back some with arms. Cued to push with one hand on walker and 1 hand from wheelchair or mat. Pt performed x 3 from wheelchair and x 3 from mat.    Stand to Sit  4: Min assist    Stand to Sit Details (indicate cue type and reason)  Verbal cues for technique    Stand to Sit Details  Pt was cued to reach back for chair which he did not do without max cuing.     Stand Pivot Transfers  2: Max assist    Stand Pivot Transfer Details (indicate cue type  and reason)  with RW but pt had trouble turning and knees flexed significantly as he started to turn needing max assist and verbal cues to stand more erect.    Squat Pivot Transfers  5: Supervision    Squat Pivot Transfer Details (indicate cue type and reason)  mat to w/c    Comments  standing at walker 2 min 36 sec. with verbal and tactile cues to tighten quads and squeeze bottom for more erect posture CGA/min assist. HR=84 after      Ambulation/Gait   Ambulation/Gait  Yes    Ambulation/Gait Assistance  2: Max assist;3: Mod assist    Ambulation/Gait Assistance Details  Pt was cued to shift weight more anterior as especially during first bout pt was leaning posterior signficantly with max assist of PT to keep upright with legs in front. During second bout PT pushed walker out further prior to each step which helped some with more anterior weight shift but knees do start to flex as goes on. Pt also had trouble at end with advancing LLE.    Ambulation Distance (Feet)  4 Feet   6' x 1   Assistive device  Rolling walker   w/c follow, gait belt   Gait Pattern   Step-to pattern;Decreased step length - right;Decreased step length - left;Right flexed knee in stance;Left flexed knee in stance    Ambulation Surface  Level;Indoor    Pre-Gait Activities  Standing at walker stepping forward and back x 5 each leg with PT providing tactile cues at left knee to tighten quad during stance min assist.      Knee/Hip Exercises: Aerobic   Other Aerobic  SciFit x 8 min level 2.5 with arms and legs. HR=78 after. Blocks placed behind wheelchair to prevent tipping.             PT Education - 03/07/20 1151    Education Details  Pt and son educated on plan to recert but will also need more auth for visits from [place] as only 2 remaining. Discussed progress thus far and that will continue to need to show progress if we do receive more auth.    Person(s) Educated  Patient;Child(ren)    Methods  Explanation    Comprehension  Verbalized understanding       PT Short Term Goals - 03/07/20 1224      PT SHORT TERM GOAL #1   Title  Pt and pt's son/daughter will be independent in initial HEP for LE strengthening/ROM. ALL STGS DUE 02/14/20    Baseline  Pt and son have been performing intial HEP    Time  5   due to delay in scheduling   Period  Weeks    Status  Achieved    Target Date  02/14/20   due to delay in scheduling     PT SHORT TERM GOAL #2   Title  Pt will perform sit <> stand with min guard with RW in order to improve functional LE strength.    Baseline  min A for sit <> stand at RW from w/c and mat table.    Time  4    Period  Weeks    Status  On-going    Target Date  04/06/20      PT SHORT TERM GOAL #3   Title  Pt and pt's son/daughter will verbalize performing pressure relief at home in order to decr risk of pressure sores due to pt spending incr time in w/c and lying  in bed.    Baseline  Pt and son have been instructed in pressure relief.    Time  5    Period  Weeks    Status  Achieved      PT SHORT TERM GOAL #4   Title  Pt will be able to stand  for at least 3 minutes at RW with min guard in order to improve tolerance for ADLs.    Baseline  2 min 36 sec at RW CGA/min assist.    Time  4    Period  Weeks    Status  On-going    Target Date  04/06/20      PT SHORT TERM GOAL #5   Title  Pt will ambulate 10' mod assist with RW for improved mobility.    Baseline  6' max assist    Time  4    Period  Weeks    Status  New    Target Date  04/06/20        PT Long Term Goals - 03/07/20 1224      PT LONG TERM GOAL #1   Title  Pt and pt's son/daughter will be independent in final HEP for LE strengthening/ROM. ALL LTGS DUE 03/13/20    Baseline  PT continues to add to HEP. Have been working a lot of standing with son at home.    Time  9    Period  Weeks    Status  On-going    Target Date  05/09/20      PT LONG TERM GOAL #2   Title  Pt will be able to perform squat pivot vs. stand pivot transfers with RW from w/c to mat table with supervision in order to decr caregiver burden.    Baseline  supervision with squat-pivot mat to w/c but max assist with stand pivot with RW having just attempted    Time  9    Period  Weeks    Status  On-going    Target Date  05/09/20      PT LONG TERM GOAL #3   Title  Pt will perform sit <> stand from w/c and mat table with RW with supervision in order to decr caregiver burden.    Baseline  min assist sit to stand at RW from w/c and mat    Time  9    Period  Weeks    Status  On-going    Target Date  05/09/20      PT LONG TERM GOAL #4   Title  Pt will tolerate standing activities at [place] for at least 6 minutes in order to improve tolerance for ADLs.    Baseline  2 min 36 sec at RW on 03/07/20    Time  9    Period  Weeks    Status  On-going    Target Date  05/09/20      PT LONG TERM GOAL #5   Title  Pt will ambulate 25' with RW min assist for improved mobility in home with son's assist.    Baseline  6' max assist    Time  9    Period  Weeks    Status  New    Target Date  05/09/20             Plan - 03/07/20 1220    Clinical Impression Statement  PT reassessed remaining goals today. Pt is supervision with squat pivot mat to w/c. Attempted stand-pivot with use of  RW for first time and patient was max assist with this. Pt now able to perform sit to stand min assist at RW from mat and w/c. While patient has not met all goals he is starting to show good progress with transfer/standing and initial gait trials. Pt with much more erect posture in standing with improving quad activation. He has increased standing time signficantly over last week at walker with less assistance for 2 min 36 sec. Pt was also able to initiate steps with first gait attempt at RW today max assist. Pt was able to advance legs on own but was leaning posterior and knees do start to flex as fatigues quickly. Pt will continue to benefit from skilled PT to continue to work on strength, transfers and gait for improved mobility. Pt has 2 visits left in initial [place] auth and will request 15 visit extension due to recent progress.    Personal Factors and Comorbidities  Age;Past/Current Experience    Examination-Activity Limitations  Bed Mobility;Stand;Transfers;Locomotion Level;Hygiene/Grooming;Bathing;Toileting    Examination-Participation Restrictions  Community Activity;Cleaning;Meal Prep    Stability/Clinical Decision Making  Evolving/Moderate complexity    Rehab Potential  Fair    PT Frequency  2x / week    PT Duration  --   9 weeks (2 visits left out of initial 15 [place] auth and requesting 15 more)   PT Treatment/Interventions  Gait training;Therapeutic exercise;Neuromuscular re-education;Therapeutic activities;ADLs/Self Care Home Management;Patient/family education;Passive range of motion    PT Next Visit Plan  Only 2 visits left in current [place] auth. Did more get approved? did OT order come in? sit <> stands from  mat table with RW, standing balance at walker, pre-gait and gait with RW. Will need w/c follow.  core/trunk/postural strengthening. supine/hooklying BLE strengthening/PROM, SciFit in w/c, Gait in // bars    Consulted and Agree with Plan of Care  Patient;Family member/caregiver    Family Member Consulted  pt's son Jeneen Rinks       Patient will benefit from skilled therapeutic intervention in order to improve the following deficits and impairments:  Decreased balance, Decreased activity tolerance, Decreased coordination, Decreased knowledge of use of DME, Decreased mobility, Decreased range of motion, Difficulty walking, Decreased strength, Impaired flexibility, Postural dysfunction, Impaired sensation, Pain  Visit Diagnosis: Other abnormalities of gait and mobility  Muscle weakness (generalized)  Unsteadiness on feet  Abnormal posture     Problem List There are no problems to display for this patient.   Electa Sniff, PT, DPT, NCS 03/07/2020, 12:44 PM  [facility] [address] [place], [place], [address] Phone: [phone]   Fax:  [phone]  Name: Wayne Dudley MRN: [id number] Date of Birth: 05/28/1923

## 2020-03-11 ENCOUNTER — Ambulatory Visit: Payer: No Typology Code available for payment source

## 2020-03-11 ENCOUNTER — Other Ambulatory Visit: Payer: Self-pay

## 2020-03-11 DIAGNOSIS — R2689 Other abnormalities of gait and mobility: Secondary | ICD-10-CM

## 2020-03-11 DIAGNOSIS — M6281 Muscle weakness (generalized): Secondary | ICD-10-CM

## 2020-03-11 NOTE — Therapy (Signed)
[facility] [address] [place], [place], [address] Phone: [phone]   Fax:  [phone]  Physical Therapy Treatment  Patient Details  Name: Wayne Dudley MRN: [id number] Date of Birth: 1923/04/22 Referring Provider (PT): Carrington Clamp, MD   Encounter Date: 03/11/2020  PT End of Session - 03/11/20 1058    Visit Number  14    Number of Visits  30   plus 15 visits from [place]   Date for PT Re-Evaluation  06/05/20   90 day cert but 60 day plan of care   Authorization Type  VA - 15 authorized visits in 120 days (use by 05/08/20)    Authorization - Visit Number  14    Authorization - Number of Visits  15    PT Start Time  1059    PT Stop Time  1145    PT Time Calculation (min)  46 min    Equipment Utilized During Treatment  Gait belt    Activity Tolerance  Patient tolerated treatment well    Behavior During Therapy  [facility] for tasks assessed/performed       History reviewed. No pertinent past medical history.  History reviewed. No pertinent surgical history.  There were no vitals filed for this visit.  Subjective Assessment - 03/11/20 1058    Subjective  Pt reports he is doing well.    Pertinent History  footpain 2/2 to frostbite injury, CKD, hard of hearing.    How long can you stand comfortably?  can stand for about 1-2 minutes before having pain (in the feet and left ankle)    Diagnostic tests  MRI of lumbar spine 11/10/19: multilevel degenerative changes, severe spinal canal stenosis at L3-L4 and L5-S1    Patient Stated Goals  wants to get back to where he can do for himself.    Currently in Pain?  Yes    Pain Score  6     Pain Location  Foot    Pain Orientation  Left    Pain Descriptors / Indicators  Burning    Pain Type  Chronic pain                       [facility] Adult PT Treatment/Exercise - 03/11/20 1059      Transfers   Transfers  Sit to Stand;Stand to Sit    Sit to Stand  4: Min  assist;3: Mod assist    Sit to Stand Details  Verbal cues for technique    Sit to Stand Details (indicate cue type and reason)  Pt was cued to bring feet back and spread out prior to rising. To push from mat with one hand and lean forward    Stand to Sit  4: Min assist    Stand to Sit Details (indicate cue type and reason)  Verbal cues for technique    Stand to Sit Details  Pt was cued to reach back prior to sitting    Comments  Sit to stand 3 x 2 from mat min assist      Ambulation/Gait   Ambulation/Gait Assistance  2: Max assist    Ambulation/Gait Assistance Details  Pt was cued to shorten rigth step and shift weight over leg before stepping. Also cued to push walker forward a little more as went. Pt leaned posterior at times and had increasing knee flexion as went on with more trouble advancing left leg using more arms to help  Ambulation Distance (Feet)  8 Feet   4' x 1   Assistive device  Rolling walker   w/c follow   Gait Pattern  Step-to pattern;Decreased step length - left;Right flexed knee in stance;Left flexed knee in stance    Ambulation Surface  Level;Indoor      Neuro Re-ed    Neuro Re-ed Details   Standing at walker CGA then adding in weight shifting side to side with verbal and tactile cues to tighten knees and gluts with weight shift x 10, then performed lifting off with left hand to touch PT hand x 3. Pt with increased flexion each time needing min assist. Standing at walker tapping 2 dots in front alternating feet x 5 then repeated after seated rest break. Min/mod assist with verbal cues to get upright and tighten knees and gluts before stepping.      Exercises   Exercises  Other Exercises    Other Exercises   Seated LAQ x 10, hamstring curl x 10. Pt was given target for LAQ with less range on left. Active assisted hamstring curl on left.      Knee/Hip Exercises: Aerobic   Other Aerobic  SciFit x 6 min level 2.5. Pt performed with legs only x 30 sec. PT provided assist  at left leg to try to keep hip in more neutral position             PT Education - 03/11/20 1258    Education Details  Pt to continue to work on  current HEP and standing with son.    Person(s) Educated  Patient;Child(ren)    Methods  Explanation    Comprehension  Verbalized understanding       PT Short Term Goals - 03/07/20 1224      PT SHORT TERM GOAL #1   Title  Pt and pt's son/daughter will be independent in initial HEP for LE strengthening/ROM. ALL STGS DUE 02/14/20    Baseline  Pt and son have been performing intial HEP    Time  5   due to delay in scheduling   Period  Weeks    Status  Achieved    Target Date  02/14/20   due to delay in scheduling     PT SHORT TERM GOAL #2   Title  Pt will perform sit <> stand with min guard with RW in order to improve functional LE strength.    Baseline  min A for sit <> stand at RW from w/c and mat table.    Time  4    Period  Weeks    Status  On-going    Target Date  04/06/20      PT SHORT TERM GOAL #3   Title  Pt and pt's son/daughter will verbalize performing pressure relief at home in order to decr risk of pressure sores due to pt spending incr time in w/c and lying in bed.    Baseline  Pt and son have been instructed in pressure relief.    Time  5    Period  Weeks    Status  Achieved      PT SHORT TERM GOAL #4   Title  Pt will be able to stand for at least 3 minutes at RW with min guard in order to improve tolerance for ADLs.    Baseline  2 min 36 sec at RW CGA/min assist.    Time  4    Period  Weeks    Status  On-going    Target Date  04/06/20      PT SHORT TERM GOAL #5   Title  Pt will ambulate 10' mod assist with RW for improved mobility.    Baseline  6' max assist    Time  4    Period  Weeks    Status  New    Target Date  04/06/20        PT Long Term Goals - 03/07/20 1224      PT LONG TERM GOAL #1   Title  Pt and pt's son/daughter will be independent in final HEP for LE strengthening/ROM. ALL LTGS DUE  03/13/20    Baseline  PT continues to add to HEP. Have been working a lot of standing with son at home.    Time  9    Period  Weeks    Status  On-going    Target Date  05/09/20      PT LONG TERM GOAL #2   Title  Pt will be able to perform squat pivot vs. stand pivot transfers with RW from w/c to mat table with supervision in order to decr caregiver burden.    Baseline  supervision with squat-pivot mat to w/c but max assist with stand pivot with RW having just attempted    Time  9    Period  Weeks    Status  On-going    Target Date  05/09/20      PT LONG TERM GOAL #3   Title  Pt will perform sit <> stand from w/c and mat table with RW with supervision in order to decr caregiver burden.    Baseline  min assist sit to stand at RW from w/c and mat    Time  9    Period  Weeks    Status  On-going    Target Date  05/09/20      PT LONG TERM GOAL #4   Title  Pt will tolerate standing activities at [facility] for at least 6 minutes in order to improve tolerance for ADLs.    Baseline  2 min 36 sec at RW on 03/07/20    Time  9    Period  Weeks    Status  On-going    Target Date  05/09/20      PT LONG TERM GOAL #5   Title  Pt will ambulate 25' with RW min assist for improved mobility in home with son's assist.    Baseline  6' max assist    Time  9    Period  Weeks    Status  New    Target Date  05/09/20            Plan - 03/11/20 1259    Clinical Impression Statement  Pt was able to progress gait slightly today but still needs max assist for safety with w/c follow. More difficult to advance left leg and pt has bilateral knee flexion as goes on.    Personal Factors and Comorbidities  Age;Past/Current Experience    Examination-Activity Limitations  Bed Mobility;Stand;Transfers;Locomotion Level;Hygiene/Grooming;Bathing;Toileting    Examination-Participation Restrictions  Community Activity;Cleaning;Meal Prep    Stability/Clinical Decision Making  Evolving/Moderate complexity    Rehab  Potential  Fair    PT Frequency  2x / week    PT Duration  --   9 weeks (2 visits left out of initial 15 [place] auth and requesting 15 more)   PT Treatment/Interventions  Gait training;Therapeutic exercise;Neuromuscular re-education;Therapeutic activities;ADLs/Self  Care Home Management;Patient/family education;Passive range of motion    PT Next Visit Plan  Only 1 visits left in current [place] auth. Did more get approved? If so need to schedule 2x/week for 7 weeks. did OT order come in? sit <> stands from  mat table with RW, standing balance at walker, pre-gait and gait with RW. Will need w/c follow. core/trunk/postural strengthening. supine/hooklying BLE strengthening/PROM, SciFit in w/c, Gait in // bars    Consulted and Agree with Plan of Care  Patient;Family member/caregiver    Family Member Consulted  pt's son [name]       Patient will benefit from skilled therapeutic intervention in order to improve the following deficits and impairments:  Decreased balance, Decreased activity tolerance, Decreased coordination, Decreased knowledge of use of DME, Decreased mobility, Decreased range of motion, Difficulty walking, Decreased strength, Impaired flexibility, Postural dysfunction, Impaired sensation, Pain  Visit Diagnosis: Other abnormalities of gait and mobility  Muscle weakness (generalized)     Problem List There are no problems to display for this patient.   Ronn Melena, PT, DPT, NCS 03/11/2020, 1:02 PM  [facility] [address] [place], [place], [address] Phone: [phone]   Fax:  [phone]  Name: Wayne Dudley MRN: [id number] Date of Birth: 12-25-1922

## 2020-03-18 ENCOUNTER — Other Ambulatory Visit: Payer: Self-pay

## 2020-03-18 ENCOUNTER — Ambulatory Visit: Payer: No Typology Code available for payment source

## 2020-03-18 DIAGNOSIS — M6281 Muscle weakness (generalized): Secondary | ICD-10-CM

## 2020-03-18 DIAGNOSIS — R2689 Other abnormalities of gait and mobility: Secondary | ICD-10-CM

## 2020-03-18 NOTE — Therapy (Signed)
[facility] [address] [place], [place], [address] Phone: [phone]   Fax:  [phone]  Physical Therapy Treatment  Patient Details  Name: Wayne Dudley MRN: [id number] Date of Birth: [birth year] Referring Provider (PT): [name], MD   Encounter Date: 03/18/2020  PT End of Session - 03/18/20 1103    Visit Number  15    Number of Visits  30   plus 15 visits from [place]   Date for PT Re-Evaluation  15/17/61   90 day cert but 60 day plan of care   Authorization Type  VA - 15 authorized visits in 120 days (use by 05/08/20)    Authorization - Visit Number  15    Authorization - Number of Visits  15    PT Start Time  1100    PT Stop Time  1143    PT Time Calculation (min)  43 min    Equipment Utilized During Treatment  Gait belt    Activity Tolerance  Patient tolerated treatment well    Behavior During Therapy  [facility] for tasks assessed/performed       No past medical history on file.  No past surgical history on file.  There were no vitals filed for this visit.  Subjective Assessment - 03/18/20 1103    Subjective  Pt reports standing seems a little bit better but not where he would like to be.    Pertinent History  footpain 2/2 to frostbite injury, CKD, hard of hearing.    How long can you stand comfortably?  can stand for about 1-2 minutes before having pain (in the feet and left ankle)    Diagnostic tests  MRI of lumbar spine 11/10/19: multilevel degenerative changes, severe spinal canal stenosis at L3-L4 and L5-S1    Patient Stated Goals  wants to get back to where he can do for himself.    Currently in Pain?  Yes    Pain Score  6     Pain Location  Foot    Pain Orientation  Left    Pain Descriptors / Indicators  Burning    Pain Type  Chronic pain                        [facility] Adult PT Treatment/Exercise - 03/18/20 1104      Transfers   Transfers  Sit to Stand;Stand to Sit;Squat Pivot  Transfers    Sit to Stand  4: Min assist    Sit to Stand Details  Verbal cues for technique    Sit to Stand Details (indicate cue type and reason)  Pt was cued to pull feet back prior to trying to stand and then push from chair with at least 1 arm and lean forward.    Stand to Sit  4: Min assist    Stand to Sit Details (indicate cue type and reason)  Verbal cues for technique    Stand to Sit Details  Pt was cued to reach back for seat to try to control descent    Comments  Pt performed sit to stand x 5 from edge of mat min assist with verbal cues to bring feet back first and push from mat with 1 hand and lean forward. Pt needed reminder to reach back with going to sit.      Ambulation/Gait   Ambulation/Gait  Yes    Ambulation/Gait Assistance  2: Max assist    Ambulation/Gait Assistance  Details  Pt was cued to keep step smaller and try to straighten up before each step. Increased knee flexion as goes on with leaning posterior some. PT also assisted minimally to help advance walker so he did not get too close to the front.    Ambulation Distance (Feet)  12 Feet    Assistive device  Rolling walker   w/c follow   Gait Pattern  Step-to pattern;Decreased step length - right;Decreased step length - left;Decreased stance time - left;Right flexed knee in stance;Left flexed knee in stance    Ambulation Surface  Level;Indoor    Pre-Gait Activities  Standing at walker stepping forward and back with RLE with PT blocking at left knee x 5 then repeated with LLE after seated rest break. Pt had more difficulty with advancing LLE having to slide more. Verbal cues to tighten stance leg at knee and bottom for more erect posture. Standing at walker weight shifting side to side x 10 with verbal and tactile cues to tighten quads with weight shift. Standing at walker with touching PT hand with 1 hand x 5 each side CGA/min assist. Pt had more difficulty when lifting left hand with more knee flexion.      Knee/Hip  Exercises: Aerobic   Other Aerobic  Sci-Fit level 2.5 x 6 min with BLE and UE. Pt was cued to push through legs as much as possible. PT helping to stabilize w/c that he was sitting in to perform.             PT Education - 03/18/20 1153    Education Details  Discussed needing to hold PT until we receive new auth from [place]. PT has sent request and son has been following up as well. To continue to work on standing at [employer] and weight shifting with son. Son may try to get RW for them to work at.    Person(s) Educated  Patient;Child(ren)    Methods  Explanation    Comprehension  Verbalized understanding       PT Short Term Goals - 03/07/20 1224      PT SHORT TERM GOAL #1   Title  Pt and pt's son/daughter will be independent in initial HEP for LE strengthening/ROM. ALL STGS DUE 02/14/20    Baseline  Pt and son have been performing intial HEP    Time  5   due to delay in scheduling   Period  Weeks    Status  Achieved    Target Date  02/14/20   due to delay in scheduling     PT SHORT TERM GOAL #2   Title  Pt will perform sit <> stand with min guard with RW in order to improve functional LE strength.    Baseline  min A for sit <> stand at RW from w/c and mat table.    Time  4    Period  Weeks    Status  On-going    Target Date  04/06/20      PT SHORT TERM GOAL #3   Title  Pt and pt's son/daughter will verbalize performing pressure relief at home in order to decr risk of pressure sores due to pt spending incr time in w/c and lying in bed.    Baseline  Pt and son have been instructed in pressure relief.    Time  5    Period  Weeks    Status  Achieved      PT SHORT TERM GOAL #4  Title  Pt will be able to stand for at least 3 minutes at RW with min guard in order to improve tolerance for ADLs.    Baseline  2 min 36 sec at RW CGA/min assist.    Time  4    Period  Weeks    Status  On-going    Target Date  04/06/20      PT SHORT TERM GOAL #5   Title  Pt will ambulate 10' mod  assist with RW for improved mobility.    Baseline  6' max assist    Time  4    Period  Weeks    Status  New    Target Date  04/06/20        PT Long Term Goals - 03/07/20 1224      PT LONG TERM GOAL #1   Title  Pt and pt's son/daughter will be independent in final HEP for LE strengthening/ROM. ALL LTGS DUE 03/13/20    Baseline  PT continues to add to HEP. Have been working a lot of standing with son at home.    Time  9    Period  Weeks    Status  On-going    Target Date  05/09/20      PT LONG TERM GOAL #2   Title  Pt will be able to perform squat pivot vs. stand pivot transfers with RW from w/c to mat table with supervision in order to decr caregiver burden.    Baseline  supervision with squat-pivot mat to w/c but max assist with stand pivot with RW having just attempted    Time  9    Period  Weeks    Status  On-going    Target Date  05/09/20      PT LONG TERM GOAL #3   Title  Pt will perform sit <> stand from w/c and mat table with RW with supervision in order to decr caregiver burden.    Baseline  min assist sit to stand at RW from w/c and mat    Time  9    Period  Weeks    Status  On-going    Target Date  05/09/20      PT LONG TERM GOAL #4   Title  Pt will tolerate standing activities at RW for at least 6 minutes in order to improve tolerance for ADLs.    Baseline  2 min 36 sec at RW on 03/07/20    Time  9    Period  Weeks    Status  On-going    Target Date  05/09/20      PT LONG TERM GOAL #5   Title  Pt will ambulate 25' with RW min assist for improved mobility in home with son's assist.    Baseline  6' max assist    Time  9    Period  Weeks    Status  New    Target Date  05/09/20            Plan - 03/18/20 1154    Clinical Impression Statement  Pt continues to show improvement in standing ability with getting more upright posture. He was able to progress gait slightly further today with maintaining more upright posture than prior visit. Still max assist  with gait and not advising to try with son yet. Pt will benefit from continued PT to continued to progress gait, strength, and functional mobility.    Personal Factors and Comorbidities  Age;Past/Current Experience    Examination-Activity Limitations  Bed Mobility;Stand;Transfers;Locomotion Level;Hygiene/Grooming;Bathing;Toileting    Examination-Participation Restrictions  Community Activity;Cleaning;Meal Prep    Stability/Clinical Decision Making  Evolving/Moderate complexity    Rehab Potential  Fair    PT Frequency  2x / week    PT Duration  --   9 weeks (2 visits left out of initial 15 [place] auth and requesting 15 more)   PT Treatment/Interventions  Gait training;Therapeutic exercise;Neuromuscular re-education;Therapeutic activities;ADLs/Self Care Home Management;Patient/family education;Passive range of motion    PT Next Visit Plan  On hold for PT until more PT auth received.  Did more get approved? If so need to schedule 2x/week for 7 weeks. did OT order come in? sit <> stands from  mat table with RW, standing balance at walker, pre-gait and gait with RW. Will need w/c follow. core/trunk/postural strengthening. supine/hooklying BLE strengthening/PROM, SciFit in w/c, Gait in // bars    Consulted and Agree with Plan of Care  Patient;Family member/caregiver    Family Member Consulted  pt's son [name]       Patient will benefit from skilled therapeutic intervention in order to improve the following deficits and impairments:  Decreased balance, Decreased activity tolerance, Decreased coordination, Decreased knowledge of use of DME, Decreased mobility, Decreased range of motion, Difficulty walking, Decreased strength, Impaired flexibility, Postural dysfunction, Impaired sensation, Pain  Visit Diagnosis: Other abnormalities of gait and mobility  Muscle weakness (generalized)     Problem List There are no problems to display for this patient.   Ronn Melena, PT, DPT, NCS 03/18/2020, 11:58  AM  [facility] [address] [place], [place], [address] Phone: [phone]   Fax:  [phone]  Name: Wayne Dudley MRN: [id number] Date of Birth: 01/03/1923

## 2020-04-08 ENCOUNTER — Other Ambulatory Visit: Payer: Self-pay

## 2020-04-08 ENCOUNTER — Ambulatory Visit: Payer: No Typology Code available for payment source | Attending: Psychiatry

## 2020-04-08 DIAGNOSIS — M6281 Muscle weakness (generalized): Secondary | ICD-10-CM | POA: Diagnosis present

## 2020-04-08 DIAGNOSIS — R2689 Other abnormalities of gait and mobility: Secondary | ICD-10-CM | POA: Insufficient documentation

## 2020-04-08 NOTE — Therapy (Signed)
[facility] [address] [place], [place], [address] Phone: [phone]   Fax:  [phone]  Physical Therapy Treatment  Patient Details  Name: Wayne Dudley MRN: [id number] Date of Birth: Mar 07, 1923 Referring Provider (PT): [name], MD   Encounter Date: 04/08/2020  PT End of Session - 04/08/20 1020    Visit Number  16    Number of Visits  30   plus 15 visits from [place]   Date for PT Re-Evaluation  41/28/78   90 day cert but 60 day plan of care   Authorization Type  [facility] - 15 authorized visits in 120 days (use by 07/23/20)    Authorization - Visit Number  1    Authorization - Number of Visits  15    PT Start Time  1016    PT Stop Time  1059    PT Time Calculation (min)  43 min    Equipment Utilized During Treatment  Gait belt    Activity Tolerance  Patient tolerated treatment well    Behavior During Therapy  [facility] for tasks assessed/performed       History reviewed. No pertinent past medical history.  History reviewed. No pertinent surgical history.  There were no vitals filed for this visit.  Subjective Assessment - 04/08/20 1021    Subjective  Pt returns after getting approval for PT extension from the [place]. Pt reports that he has been working on his standing at home with son.    Pertinent History  footpain 2/2 to frostbite injury, CKD, hard of hearing.    How long can you stand comfortably?  can stand for about 1-2 minutes before having pain (in the feet and left ankle)    Diagnostic tests  MRI of lumbar spine 11/10/19: multilevel degenerative changes, severe spinal canal stenosis at L3-L4 and L5-S1    Patient Stated Goals  wants to get back to where he can do for himself.    Currently in Pain?  Yes    Pain Score  6     Pain Location  Foot    Pain Orientation  Left    Pain Descriptors / Indicators  Burning    Pain Type  Chronic pain                        [facility] Adult PT  Treatment/Exercise - 04/08/20 1022      Transfers   Transfers  Sit to Stand;Stand to Sit;Squat Pivot Transfers;Stand Pivot Transfers    Sit to Stand  4: Min assist    Five time sit to stand comments   Performed from edge of mat x 5 CGA/min assist with pushing with 1 hand on mat and 1 on walker.     Stand to Sit  4: Min assist    Stand to Sit Details (indicate cue type and reason)  Verbal cues for technique    Stand to Sit Details  Pt was cued to reach back prior to sitting    Stand Pivot Transfers  2: Max assist    Stand Pivot Transfer Details (indicate cue type and reason)  mat to/from w/c with RW max assist with PT helping to turn walker.    Squat Pivot Transfers  4: Min guard    Squat Pivot Transfer Details (indicate cue type and reason)  w/c to mat with patient lifting bottom completely.    Comments  Pt stood at RW 2 min 7 sec.with verbal  cues to straighten knees for more erect posture      Ambulation/Gait   Ambulation/Gait  Yes    Ambulation/Gait Assistance  2: Max assist;3: Mod assist    Ambulation/Gait Assistance Details  Pt was cued to step in to middle of walker and try to keep knees straighter for more upright posture. Tactile cues at left knee to help straighten as well. Max assist first bout and then more mod assist 2nd bout with pt doing better advancing walker. HR=74 after gait    Ambulation Distance (Feet)  10 Feet   10' x 1   Assistive device  Rolling walker   w/c follow   Gait Pattern  Step-to pattern;Decreased step length - right;Decreased step length - left;Right flexed knee in stance;Left flexed knee in stance;Poor foot clearance - left    Ambulation Surface  Level;Indoor    Pre-Gait Activities  Standing at walker stepping right foot forward and back x 4 min assist and verbal and tactile cuing to straighten left knee. After seated rest break repeated with stepping forward and back x 5 each leg min assist.      Knee/Hip Exercises: Aerobic   Other Aerobic  Sci-Fit level  1.5 with arms and legs x 6 min with verbal cues and tactile cues to try to keep left hip in more neutral position. PT stabilizing transport chair to prevent tipping.             PT Education - 04/08/20 1521    Education Details  Pt to continue to work on standing with son    Person(s) Educated  Patient;Child(ren)    Methods  Explanation    Comprehension  Verbalized understanding       PT Short Term Goals - 04/08/20 1521      PT SHORT TERM GOAL #1   Title  Pt and pt's son/daughter will be independent in initial HEP for LE strengthening/ROM. ALL STGS DUE 02/14/20    Baseline  Pt and son have been performing intial HEP    Time  5   due to delay in scheduling   Period  Weeks    Status  Achieved    Target Date  02/14/20   due to delay in scheduling     PT SHORT TERM GOAL #2   Title  Pt will perform sit <> stand with min guard with RW in order to improve functional LE strength.    Baseline  min A for sit <> stand at RW from w/c and mat table.    Time  4    Period  Weeks    Status  On-going    Target Date  05/08/20      PT SHORT TERM GOAL #3   Title  Pt and pt's son/daughter will verbalize performing pressure relief at home in order to decr risk of pressure sores due to pt spending incr time in w/c and lying in bed.    Baseline  Pt and son have been instructed in pressure relief.    Time  5    Period  Weeks    Status  Achieved      PT SHORT TERM GOAL #4   Title  Pt will be able to stand for at least 3 minutes at RW with min guard in order to improve tolerance for ADLs.    Baseline  2 min 36 sec at RW CGA/min assist.    Time  4    Period  Weeks  Status  On-going    Target Date  05/08/20      PT SHORT TERM GOAL #5   Title  Pt will ambulate 10' mod assist with RW for improved mobility.    Baseline  10' max/mod assist with RW    Time  4    Period  Weeks    Status  Partially Met    Target Date  05/08/20        PT Long Term Goals - 04/08/20 1523      PT LONG TERM  GOAL #1   Title  Pt and pt's son/daughter will be independent in final HEP for LE strengthening/ROM.    Baseline  PT continues to add to HEP. Have been working a lot of standing with son at home.    Time  9    Period  Weeks    Status  On-going    Target Date  06/07/20      PT LONG TERM GOAL #2   Title  Pt will be able to perform squat pivot vs. stand pivot transfers with RW from w/c to mat table with supervision in order to decr caregiver burden.    Baseline  supervision with squat-pivot mat to w/c but max assist with stand pivot with RW having just attempted    Time  9    Period  Weeks    Status  On-going    Target Date  06/07/20      PT LONG TERM GOAL #3   Title  Pt will perform sit <> stand from w/c and mat table with RW with supervision in order to decr caregiver burden.    Baseline  min assist sit to stand at RW from w/c and mat    Time  9    Period  Weeks    Status  On-going    Target Date  06/07/20      PT LONG TERM GOAL #4   Title  Pt will tolerate standing activities at [place] for at least 6 minutes in order to improve tolerance for ADLs.    Baseline  2 min 36 sec at RW on 03/07/20    Time  9    Period  Weeks    Status  On-going    Target Date  06/07/20      PT LONG TERM GOAL #5   Title  Pt will ambulate 25' with RW min assist for improved mobility in home with son's assist.    Baseline  6' max assist    Time  9    Period  Weeks    Status  New    Target Date  06/07/20            Plan - 04/08/20 1524    Clinical Impression Statement  Pt returns to therapy after being on hold waiting on [place] auth. Pt has not regressed since last seen. He continues to show improvement in transfers and standing ability, requiring less assistance. Pt was able to ambulate 10' with RW with max/mod assist today. PT updated targets dates on goals due to delay in being seen.    Personal Factors and Comorbidities  Age;Past/Current Experience    Examination-Activity Limitations  Bed  Mobility;Stand;Transfers;Locomotion Level;Hygiene/Grooming;Bathing;Toileting    Examination-Participation Restrictions  Community Activity;Cleaning;Meal Prep    Stability/Clinical Decision Making  Evolving/Moderate complexity    Rehab Potential  Fair    PT Frequency  2x / week    PT Duration  --   9  weeks (2 visits left out of initial [place] and requesting 15 more)   PT Treatment/Interventions  Gait training;Therapeutic exercise;Neuromuscular re-education;Therapeutic activities;ADLs/Self Care Home Management;Patient/family education;Passive range of motion    PT Next Visit Plan  did OT order come in? sit <> stands from  mat table with RW, standing balance at walker, pre-gait and gait with RW. Will need w/c follow. core/trunk/postural strengthening. supine/hooklying BLE strengthening/PROM, SciFit in w/c    Consulted and Agree with Plan of Care  Patient;Family member/caregiver    Family Member Consulted  pt's son Jeneen Rinks       Patient will benefit from skilled therapeutic intervention in order to improve the following deficits and impairments:  Decreased balance, Decreased activity tolerance, Decreased coordination, Decreased knowledge of use of DME, Decreased mobility, Decreased range of motion, Difficulty walking, Decreased strength, Impaired flexibility, Postural dysfunction, Impaired sensation, Pain  Visit Diagnosis: Other abnormalities of gait and mobility  Muscle weakness (generalized)     Problem List There are no problems to display for this patient.   Electa Sniff, PT, DPT, NCS 04/08/2020, 3:27 PM  [facility] [address] [place], [place], [address] Phone: [phone]   Fax:  [phone]  Name: Wayne Dudley MRN: [id number] Date of Birth: [birth year]

## 2020-04-10 ENCOUNTER — Ambulatory Visit: Payer: No Typology Code available for payment source

## 2020-04-10 ENCOUNTER — Other Ambulatory Visit: Payer: Self-pay

## 2020-04-10 DIAGNOSIS — M6281 Muscle weakness (generalized): Secondary | ICD-10-CM

## 2020-04-10 DIAGNOSIS — R2689 Other abnormalities of gait and mobility: Secondary | ICD-10-CM | POA: Diagnosis not present

## 2020-04-10 NOTE — Therapy (Signed)
[facility] [address] [place], [place], [address] Phone: [phone]   Fax:  [phone]  Physical Therapy Treatment  Patient Details  Name: Wayne Dudley MRN: [id number] Date of Birth: Feb 23, 1923 Referring Provider (PT): [name], MD   Encounter Date: 04/10/2020   PT End of Session - 04/10/20 1018    Visit Number 17    Number of Visits 30   plus 15 visits from [place]   Date for PT Re-Evaluation 18/84/16   90 day cert but 60 day plan of care   Authorization Type VA - 15 authorized visits in 120 days (use by 07/23/20)    Authorization - Visit Number 2    Authorization - Number of Visits 15    PT Start Time 1016    PT Stop Time 1100    PT Time Calculation (min) 44 min    Equipment Utilized During Treatment Gait belt    Activity Tolerance Patient tolerated treatment well    Behavior During Therapy [facility] for tasks assessed/performed           No past medical history on file.  No past surgical history on file.  There were no vitals filed for this visit.   Subjective Assessment - 04/10/20 1020    Subjective Pt reports he feels good. Less pain since he has been moving more.    Pertinent History footpain 2/2 to frostbite injury, CKD, hard of hearing.    How long can you stand comfortably? can stand for about 1-2 minutes before having pain (in the feet and left ankle)    Diagnostic tests MRI of lumbar spine 11/10/19: multilevel degenerative changes, severe spinal canal stenosis at L3-L4 and L5-S1    Patient Stated Goals wants to get back to where he can do for himself.    Currently in Pain? Yes    Pain Score 5     Pain Location Foot    Pain Orientation Left    Pain Descriptors / Indicators Burning    Pain Type Chronic pain                             [facility] Adult PT Treatment/Exercise - 04/10/20 1021      Transfers   Transfers Sit to Stand;Stand to [employer]    Sit to  Stand 4: Min assist    Sit to Stand Details Verbal cues for technique    Stand to Sit 4: Min assist    Stand to Sit Details (indicate cue type and reason) Verbal cues for technique    Stand Pivot Transfers 2: Max assist    Stand Pivot Transfer Details (indicate cue type and reason) w/c to mat with RW. Verbal cues to turn walker and stand tall. Performed x 2.      Ambulation/Gait   Ambulation/Gait Yes    Ambulation/Gait Assistance 2: Max assist;3: Mod assist    Ambulation/Gait Assistance Details HR=90 after 2nd gait bout. Pt was cued to push through legs after moving walker to help advance foot. Pt with increased flexion on left at times when had to let up on arms some to advance walker.    Ambulation Distance (Feet) 12 Feet   17' x 2   Assistive device Rolling walker   w/c follow   Gait Pattern Step-to pattern;Step-through pattern;Decreased hip/knee flexion - right;Decreased hip/knee flexion - left;Right flexed knee in stance;Left flexed knee in stance;Poor foot clearance -  left;Poor foot clearance - right    Ambulation Surface Level;Indoor      Exercises   Exercises Other Exercises    Other Exercises  Seated edge of mat: LAQ x 10 right with 2# weight and x 7 left with verbal and tactile cues to stay up tall as wanting to lean back. Partial range on left. Seated resisted right hamstring curl x 10 with light manual resistance, left hamstring curl sliding foot on foam roll x 10. Right hip abd/add x 10 and then left isometric abduction x 7 with 5 sec holds                    PT Short Term Goals - 04/08/20 1521      PT SHORT TERM GOAL #1   Title Pt and pt's son/daughter will be independent in initial HEP for LE strengthening/ROM. ALL STGS DUE 02/14/20    Baseline Pt and son have been performing intial HEP    Time 5   due to delay in scheduling   Period Weeks    Status Achieved    Target Date 02/14/20   due to delay in scheduling     PT SHORT TERM GOAL #2   Title Pt will perform  sit <> stand with min guard with RW in order to improve functional LE strength.    Baseline min A for sit <> stand at RW from w/c and mat table.    Time 4    Period Weeks    Status On-going    Target Date 05/08/20      PT SHORT TERM GOAL #3   Title Pt and pt's son/daughter will verbalize performing pressure relief at home in order to decr risk of pressure sores due to pt spending incr time in w/c and lying in bed.    Baseline Pt and son have been instructed in pressure relief.    Time 5    Period Weeks    Status Achieved      PT SHORT TERM GOAL #4   Title Pt will be able to stand for at least 3 minutes at RW with min guard in order to improve tolerance for ADLs.    Baseline 2 min 36 sec at RW CGA/min assist.    Time 4    Period Weeks    Status On-going    Target Date 05/08/20      PT SHORT TERM GOAL #5   Title Pt will ambulate 10' mod assist with RW for improved mobility.    Baseline 10' max/mod assist with RW    Time 4    Period Weeks    Status Partially Met    Target Date 05/08/20             PT Long Term Goals - 04/08/20 1523      PT LONG TERM GOAL #1   Title Pt and pt's son/daughter will be independent in final HEP for LE strengthening/ROM.    Baseline PT continues to add to HEP. Have been working a lot of standing with son at home.    Time 9    Period Weeks    Status On-going    Target Date 06/07/20      PT LONG TERM GOAL #2   Title Pt will be able to perform squat pivot vs. stand pivot transfers with RW from w/c to mat table with supervision in order to decr caregiver burden.    Baseline supervision with squat-pivot mat  to w/c but max assist with stand pivot with RW having just attempted    Time 9    Period Weeks    Status On-going    Target Date 06/07/20      PT LONG TERM GOAL #3   Title Pt will perform sit <> stand from w/c and mat table with RW with supervision in order to decr caregiver burden.    Baseline min assist sit to stand at RW from w/c and mat     Time 9    Period Weeks    Status On-going    Target Date 06/07/20      PT LONG TERM GOAL #4   Title Pt will tolerate standing activities at [place] for at least 6 minutes in order to improve tolerance for ADLs.    Baseline 2 min 36 sec at RW on 03/07/20    Time 9    Period Weeks    Status On-going    Target Date 06/07/20      PT LONG TERM GOAL #5   Title Pt will ambulate 25' with RW min assist for improved mobility in home with son's assist.    Baseline 6' max assist    Time 9    Period Weeks    Status New    Target Date 06/07/20                 Plan - 04/10/20 1938    Clinical Impression Statement Pt was able to progress gait distance and bouts today. Able to assume more upright posture than prior visits but does demonstrate more weakness on LLE with increased flexion.    Personal Factors and Comorbidities Age;Past/Current Experience    Examination-Activity Limitations Bed Mobility;Stand;Transfers;Locomotion Level;Hygiene/Grooming;Bathing;Toileting    Examination-Participation Restrictions Community Activity;Cleaning;Meal Prep    Stability/Clinical Decision Making Evolving/Moderate complexity    Rehab Potential Fair    PT Frequency 2x / week    PT Duration --   9 weeks (2 visits left out of initial 15 [place] auth and requesting 15 more)   PT Treatment/Interventions Gait training;Therapeutic exercise;Neuromuscular re-education;Therapeutic activities;ADLs/Self Care Home Management;Patient/family education;Passive range of motion    PT Next Visit Plan sit <> stands from  mat table with RW, standing balance at walker, pre-gait and gait with RW. Will need w/c follow. core/trunk/postural strengthening. supine/hooklying BLE strengthening/PROM, SciFit in w/c    Consulted and Agree with Plan of Care Patient;Family member/caregiver    Family Member Consulted pt's son Jeneen Rinks           Patient will benefit from skilled therapeutic intervention in order to improve the following deficits  and impairments:  Decreased balance, Decreased activity tolerance, Decreased coordination, Decreased knowledge of use of DME, Decreased mobility, Decreased range of motion, Difficulty walking, Decreased strength, Impaired flexibility, Postural dysfunction, Impaired sensation, Pain  Visit Diagnosis: Other abnormalities of gait and mobility  Muscle weakness (generalized)     Problem List There are no problems to display for this patient.   Electa Sniff, PT, DPT, NCS 04/10/2020, 7:40 PM  [facility] [address] [place], [place], [address] Phone: [phone]   Fax:  [phone]  Name: Wayne Dudley MRN: [id number] Date of Birth: Nov 06, 1922

## 2020-04-25 ENCOUNTER — Other Ambulatory Visit: Payer: Self-pay

## 2020-04-25 ENCOUNTER — Ambulatory Visit: Payer: No Typology Code available for payment source

## 2020-04-25 DIAGNOSIS — M6281 Muscle weakness (generalized): Secondary | ICD-10-CM

## 2020-04-25 DIAGNOSIS — R2689 Other abnormalities of gait and mobility: Secondary | ICD-10-CM | POA: Diagnosis not present

## 2020-04-25 NOTE — Therapy (Signed)
[facility] [address] [place], [place], [address] Phone: [phone]   Fax:  [phone]  Physical Therapy Treatment  Patient Details  Name: Wayne Dudley MRN: [id number] Date of Birth: 04-28-1923 Referring Provider (PT): [name], MD   Encounter Date: 04/25/2020   PT End of Session - 04/25/20 1320    Visit Number 18    Number of Visits 30   plus 15 visits from [place]   Date for PT Re-Evaluation 91/63/84   90 day cert but 60 day plan of care   Authorization Type VA - 15 authorized visits in 120 days (use by 07/23/20)    Authorization - Visit Number 3    Authorization - Number of Visits 15    PT Start Time 6659    PT Stop Time 1400    PT Time Calculation (min) 42 min    Equipment Utilized During Treatment Gait belt    Activity Tolerance Patient tolerated treatment well    Behavior During Therapy [facility] for tasks assessed/performed           History reviewed. No pertinent past medical history.  History reviewed. No pertinent surgical history.  There were no vitals filed for this visit.   Subjective Assessment - 04/25/20 1332    Subjective Pt reports doing good. Has been doing standing and feels he could try to walk more.    Pertinent History footpain 2/2 to frostbite injury, CKD, hard of hearing.    How long can you stand comfortably? can stand for about 1-2 minutes before having pain (in the feet and left ankle)    Diagnostic tests MRI of lumbar spine 11/10/19: multilevel degenerative changes, severe spinal canal stenosis at L3-L4 and L5-S1    Patient Stated Goals wants to get back to where he can do for himself.    Currently in Pain? Yes    Pain Location Foot    Pain Orientation Left    Pain Descriptors / Indicators Burning    Pain Type Chronic pain    Pain Onset More than a month ago    Pain Frequency Constant                             [facility] Adult PT Treatment/Exercise -  04/25/20 1321      Transfers   Transfers Sit to Stand;Stand to Sit    Sit to Stand 4: Min guard;4: Min assist    Sit to Stand Details Verbal cues for technique    Stand to Sit 4: Min assist    Stand to Sit Details (indicate cue type and reason) Verbal cues for technique    Stand to Sit Details Pt was cued to reach back prior to sitting      Ambulation/Gait   Ambulation/Gait Yes    Ambulation/Gait Assistance 3: Mod assist;2: Max assist    Ambulation/Gait Assistance Details Pt was given verbal cues to stand tall throughout. Tactile cues occasionally at left knee to try to get more quad contraction during stance. Wheelchair follow by son. Seated rest break between bouts. Pt became more flexed towards end of bouts. HR=90 after    Ambulation Distance (Feet) 21 Feet   28' x 1, 20' x 1   Assistive device Rolling walker    Gait Pattern Step-to pattern;Step-through pattern;Decreased step length - right;Decreased step length - left;Left flexed knee in stance;Decreased dorsiflexion - right;Decreased dorsiflexion - left    Ambulation  Surface Level;Indoor    Pre-Gait Activities Pt stood at walker with alternating reaching out in front with UE x 5 each, then stepping forward and back with each foot x 10 each. Min assist/CGA. Pt leaning posterior some with reaching with increased flexion especially with raising left arm. Better job keeping knees straighter with stepping today.      Knee/Hip Exercises: Aerobic   Other Aerobic SciFit x 5 min level 3 with arms and legs. Wedges behind to steady pt's w/c. Pt was given verbal cues to try to keep her hips in neutral position.                    PT Short Term Goals - 04/08/20 1521      PT SHORT TERM GOAL #1   Title Pt and pt's son/daughter will be independent in initial HEP for LE strengthening/ROM. ALL STGS DUE 02/14/20    Baseline Pt and son have been performing intial HEP    Time 5   due to delay in scheduling   Period Weeks    Status Achieved     Target Date 02/14/20   due to delay in scheduling     PT SHORT TERM GOAL #2   Title Pt will perform sit <> stand with min guard with RW in order to improve functional LE strength.    Baseline min A for sit <> stand at RW from w/c and mat table.    Time 4    Period Weeks    Status On-going    Target Date 05/08/20      PT SHORT TERM GOAL #3   Title Pt and pt's son/daughter will verbalize performing pressure relief at home in order to decr risk of pressure sores due to pt spending incr time in w/c and lying in bed.    Baseline Pt and son have been instructed in pressure relief.    Time 5    Period Weeks    Status Achieved      PT SHORT TERM GOAL #4   Title Pt will be able to stand for at least 3 minutes at RW with min guard in order to improve tolerance for ADLs.    Baseline 2 min 36 sec at RW CGA/min assist.    Time 4    Period Weeks    Status On-going    Target Date 05/08/20      PT SHORT TERM GOAL #5   Title Pt will ambulate 10' mod assist with RW for improved mobility.    Baseline 10' max/mod assist with RW    Time 4    Period Weeks    Status Partially Met    Target Date 05/08/20             PT Long Term Goals - 04/08/20 1523      PT LONG TERM GOAL #1   Title Pt and pt's son/daughter will be independent in final HEP for LE strengthening/ROM.    Baseline PT continues to add to HEP. Have been working a lot of standing with son at home.    Time 9    Period Weeks    Status On-going    Target Date 06/07/20      PT LONG TERM GOAL #2   Title Pt will be able to perform squat pivot vs. stand pivot transfers with RW from w/c to mat table with supervision in order to decr caregiver burden.    Baseline supervision with squat-pivot  mat to w/c but max assist with stand pivot with RW having just attempted    Time 9    Period Weeks    Status On-going    Target Date 06/07/20      PT LONG TERM GOAL #3   Title Pt will perform sit <> stand from w/c and mat table with RW with  supervision in order to decr caregiver burden.    Baseline min assist sit to stand at RW from w/c and mat    Time 9    Period Weeks    Status On-going    Target Date 06/07/20      PT LONG TERM GOAL #4   Title Pt will tolerate standing activities at [place] for at least 6 minutes in order to improve tolerance for ADLs.    Baseline 2 min 36 sec at RW on 03/07/20    Time 9    Period Weeks    Status On-going    Target Date 06/07/20      PT LONG TERM GOAL #5   Title Pt will ambulate 25' with RW min assist for improved mobility in home with son's assist.    Baseline 6' max assist    Time 9    Period Weeks    Status New    Target Date 06/07/20                 Plan - 04/25/20 1718    Clinical Impression Statement PT continued to focus on standing and gait. Pt showing improving gait with less assistance and more upright posture. He was able to increase gait distance in all bouts today.    Personal Factors and Comorbidities Age;Past/Current Experience    Examination-Activity Limitations Bed Mobility;Stand;Transfers;Locomotion Level;Hygiene/Grooming;Bathing;Toileting    Examination-Participation Restrictions Community Activity;Cleaning;Meal Prep    Stability/Clinical Decision Making Evolving/Moderate complexity    Rehab Potential Fair    PT Frequency 2x / week    PT Duration --   9 weeks (2 visits left out of initial 15 [place] auth and requesting 15 more)   PT Treatment/Interventions Gait training;Therapeutic exercise;Neuromuscular re-education;Therapeutic activities;ADLs/Self Care Home Management;Patient/family education;Passive range of motion    PT Next Visit Plan sit <> stands from  mat table with RW, standing balance at walker, pre-gait and gait with RW. Will need w/c follow. core/trunk/postural strengthening. supine/hooklying BLE strengthening/PROM, SciFit in w/c    Consulted and Agree with Plan of Care Patient;Family member/caregiver    Family Member Consulted pt's son Jeneen Rinks            Patient will benefit from skilled therapeutic intervention in order to improve the following deficits and impairments:  Decreased balance, Decreased activity tolerance, Decreased coordination, Decreased knowledge of use of DME, Decreased mobility, Decreased range of motion, Difficulty walking, Decreased strength, Impaired flexibility, Postural dysfunction, Impaired sensation, Pain  Visit Diagnosis: Other abnormalities of gait and mobility  Muscle weakness (generalized)     Problem List There are no problems to display for this patient.   Electa Sniff, PT, DPT, NCS 04/25/2020, 5:20 PM  [facility] [address] [place], [place], [address] Phone: [phone]   Fax:  [phone]  Name: Ryane Canavan MRN: [id number] Date of Birth: [birth year]

## 2020-04-29 ENCOUNTER — Other Ambulatory Visit: Payer: Self-pay

## 2020-04-29 ENCOUNTER — Ambulatory Visit: Payer: No Typology Code available for payment source

## 2020-04-29 DIAGNOSIS — R2689 Other abnormalities of gait and mobility: Secondary | ICD-10-CM

## 2020-04-29 DIAGNOSIS — M6281 Muscle weakness (generalized): Secondary | ICD-10-CM

## 2020-04-29 NOTE — Therapy (Signed)
[name] [address] [place], [place], [address] Phone: [phone]   Fax:  [phone]  Physical Therapy Treatment  Patient Details  Name: Wayne Dudley MRN: [id number] Date of Birth: [birth year] Referring Provider (PT): [name], MD   Encounter Date: 04/29/2020   PT End of Session - 04/29/20 1104    Visit Number 19    Number of Visits 30   plus 15 visits from [place]   Date for PT Re-Evaluation 93/26/71   90 day cert but 60 day plan of care   Authorization Type VA - 15 authorized visits in 120 days (use by 07/23/20)    Authorization - Visit Number 4    Authorization - Number of Visits 15    PT Start Time 1101    PT Stop Time 1145    PT Time Calculation (min) 44 min    Equipment Utilized During Treatment Gait belt    Activity Tolerance Patient tolerated treatment well    Behavior During Therapy [facility] for tasks assessed/performed           No past medical history on file.  No past surgical history on file.  There were no vitals filed for this visit.   Subjective Assessment - 04/29/20 1103    Subjective Pt denies any new issues.    Pertinent History footpain 2/2 to frostbite injury, CKD, hard of hearing.    How long can you stand comfortably? can stand for about 1-2 minutes before having pain (in the feet and left ankle)    Diagnostic tests MRI of lumbar spine 11/10/19: multilevel degenerative changes, severe spinal canal stenosis at L3-L4 and L5-S1    Patient Stated Goals wants to get back to where he can do for himself.    Currently in Pain? Yes    Pain Score 6     Pain Location Foot    Pain Orientation Left    Pain Descriptors / Indicators Burning    Pain Type Chronic pain    Pain Onset More than a month ago                             [facility] Adult PT Treatment/Exercise - 04/29/20 1104      Transfers   Transfers Sit to Stand;Stand to Sit    Sit to Stand 4: Min guard;4: Min  assist    Sit to Stand Details Verbal cues for technique    Stand to Sit 4: Min assist    Stand to Sit Details (indicate cue type and reason) Verbal cues for technique    Stand to Sit Details Pt was cued to be sure to reach back for chair prior to sitting.      Ambulation/Gait   Ambulation/Gait Yes    Ambulation/Gait Assistance 3: Mod assist    Ambulation/Gait Assistance Details Pt was given verbal and tactile cues to stay erect and try to straighten knees more during stance to help with foot clearance. HR=96 after gait. Pt reports legs more tired    Ambulation Distance (Feet) 12 Feet   18' x 1   Assistive device Rolling walker   w/c follow   Gait Pattern Step-to pattern;Decreased step length - right;Decreased step length - left;Decreased dorsiflexion - right;Decreased dorsiflexion - left;Right flexed knee in stance;Left flexed knee in stance    Ambulation Surface Level;Indoor      Neuro Re-ed    Neuro Re-ed Details  Standing  in front of // bar: alternating step-to 2" // bar platform x 3 each leg. Pt had difficulty with bringing legs back off especially right leg as had increased left knee flexion min assist. Standing with reaching for targets out front with each hand x 4. Standing with 1 UE support reaching across body for 3 cones on table and moving to other side x 4 each side then seated rest break and repeated another time with 4 trials of 3 cones. Min assist with last activity with PT helping to straighten hips more as tends to rotate towards right when letting go on left.      Knee/Hip Exercises: Aerobic   Other Aerobic Sci-Fit x 5 min level 3.5 with BUE and BLE.Wedges placed behind w/c for safety. HR=76 after NuStep                    PT Short Term Goals - 04/08/20 1521      PT SHORT TERM GOAL #1   Title Pt and pt's son/daughter will be independent in initial HEP for LE strengthening/ROM. ALL STGS DUE 02/14/20    Baseline Pt and son have been performing intial HEP    Time  5   due to delay in scheduling   Period Weeks    Status Achieved    Target Date 02/14/20   due to delay in scheduling     PT SHORT TERM GOAL #2   Title Pt will perform sit <> stand with min guard with RW in order to improve functional LE strength.    Baseline min A for sit <> stand at RW from w/c and mat table.    Time 4    Period Weeks    Status On-going    Target Date 05/08/20      PT SHORT TERM GOAL #3   Title Pt and pt's son/daughter will verbalize performing pressure relief at home in order to decr risk of pressure sores due to pt spending incr time in w/c and lying in bed.    Baseline Pt and son have been instructed in pressure relief.    Time 5    Period Weeks    Status Achieved      PT SHORT TERM GOAL #4   Title Pt will be able to stand for at least 3 minutes at RW with min guard in order to improve tolerance for ADLs.    Baseline 2 min 36 sec at RW CGA/min assist.    Time 4    Period Weeks    Status On-going    Target Date 05/08/20      PT SHORT TERM GOAL #5   Title Pt will ambulate 10' mod assist with RW for improved mobility.    Baseline 10' max/mod assist with RW    Time 4    Period Weeks    Status Partially Met    Target Date 05/08/20             PT Long Term Goals - 04/08/20 1523      PT LONG TERM GOAL #1   Title Pt and pt's son/daughter will be independent in final HEP for LE strengthening/ROM.    Baseline PT continues to add to HEP. Have been working a lot of standing with son at home.    Time 9    Period Weeks    Status On-going    Target Date 06/07/20      PT LONG TERM GOAL #2  Title Pt will be able to perform squat pivot vs. stand pivot transfers with RW from w/c to mat table with supervision in order to decr caregiver burden.    Baseline supervision with squat-pivot mat to w/c but max assist with stand pivot with RW having just attempted    Time 9    Period Weeks    Status On-going    Target Date 06/07/20      PT LONG TERM GOAL #3    Title Pt will perform sit <> stand from w/c and mat table with RW with supervision in order to decr caregiver burden.    Baseline min assist sit to stand at RW from w/c and mat    Time 9    Period Weeks    Status On-going    Target Date 06/07/20      PT LONG TERM GOAL #4   Title Pt will tolerate standing activities at [place] for at least 6 minutes in order to improve tolerance for ADLs.    Baseline 2 min 36 sec at RW on 03/07/20    Time 9    Period Weeks    Status On-going    Target Date 06/07/20      PT LONG TERM GOAL #5   Title Pt will ambulate 25' with RW min assist for improved mobility in home with son's assist.    Baseline 6' max assist    Time 9    Period Weeks    Status New    Target Date 06/07/20                 Plan - 04/29/20 1436    Clinical Impression Statement PT continued to focus more on improving upright posture in standing as well as gait training. Pt reported more fatigue with gait today having performed after standing activities.    Personal Factors and Comorbidities Age;Past/Current Experience    Examination-Activity Limitations Bed Mobility;Stand;Transfers;Locomotion Level;Hygiene/Grooming;Bathing;Toileting    Examination-Participation Restrictions Community Activity;Cleaning;Meal Prep    Stability/Clinical Decision Making Evolving/Moderate complexity    Rehab Potential Fair    PT Frequency 2x / week    PT Duration --   9 weeks (2 visits left out of initial 15 [place] auth and requesting 15 more)   PT Treatment/Interventions Gait training;Therapeutic exercise;Neuromuscular re-education;Therapeutic activities;ADLs/Self Care Home Management;Patient/family education;Passive range of motion    PT Next Visit Plan sit <> stands from  mat table and w/c with RW, standing balance at walker, pre-gait and gait with RW. Will need w/c follow. core/trunk/postural strengthening. SciFit in w/c    Consulted and Agree with Plan of Care Patient;Family member/caregiver    Family  Member Consulted pt's son Jeneen Rinks           Patient will benefit from skilled therapeutic intervention in order to improve the following deficits and impairments:  Decreased balance, Decreased activity tolerance, Decreased coordination, Decreased knowledge of use of DME, Decreased mobility, Decreased range of motion, Difficulty walking, Decreased strength, Impaired flexibility, Postural dysfunction, Impaired sensation, Pain  Visit Diagnosis: Other abnormalities of gait and mobility  Muscle weakness (generalized)     Problem List There are no problems to display for this patient.   Electa Sniff, PT, DPT, NCS 04/29/2020, 2:38 PM  [facility] [address] [place], [place], [address] Phone: [phone]   Fax:  [phone]  Name: Jaizon Deroos MRN: [id number] Date of Birth: 07/14/1923

## 2020-05-01 ENCOUNTER — Other Ambulatory Visit: Payer: Self-pay

## 2020-05-01 ENCOUNTER — Ambulatory Visit: Payer: No Typology Code available for payment source | Attending: Psychiatry | Admitting: Physical Therapy

## 2020-05-01 DIAGNOSIS — M25511 Pain in right shoulder: Secondary | ICD-10-CM | POA: Diagnosis present

## 2020-05-01 DIAGNOSIS — G8929 Other chronic pain: Secondary | ICD-10-CM | POA: Diagnosis present

## 2020-05-01 DIAGNOSIS — M6281 Muscle weakness (generalized): Secondary | ICD-10-CM | POA: Diagnosis present

## 2020-05-01 DIAGNOSIS — R29818 Other symptoms and signs involving the nervous system: Secondary | ICD-10-CM | POA: Insufficient documentation

## 2020-05-01 DIAGNOSIS — R2681 Unsteadiness on feet: Secondary | ICD-10-CM | POA: Diagnosis present

## 2020-05-01 DIAGNOSIS — R293 Abnormal posture: Secondary | ICD-10-CM | POA: Diagnosis present

## 2020-05-01 DIAGNOSIS — R209 Unspecified disturbances of skin sensation: Secondary | ICD-10-CM | POA: Diagnosis present

## 2020-05-01 DIAGNOSIS — R2689 Other abnormalities of gait and mobility: Secondary | ICD-10-CM | POA: Diagnosis not present

## 2020-05-01 NOTE — Patient Instructions (Signed)
Access Code: [email or web address] URL: [email or web address] Date: 05/01/2020 Prepared by: Sherlie Ban  Exercises Seated Hip Abduction with Resistance - 2 x daily - 5 x weekly - 2 sets - 10 reps Seated Hip Adduction Isometrics with Ball - 2 x daily - 5 x weekly - 2 sets - 10 reps Supine Gluteal Sets - 2 x daily - 5 x weekly - 2 sets - 10 reps Seated Heel Slide - 2 x daily - 5 x weekly - 2 sets - 10 reps Supine Hip and Knee Flexion PROM with Caregiver - 2 x daily - 5 x weekly - 3 sets - 20 hold Supine Ankle Dorsiflexion Stretch with Caregiver - 2 x daily - 5 x weekly - 3 sets - 20 hold Seated Scapular Retraction - 1-2 x daily - 5 x weekly - 2 sets - 10 reps Sitting Knee Extension with Resistance - 1 x daily - 5 x weekly - 2 sets - 5 reps

## 2020-05-01 NOTE — Therapy (Signed)
[facility] [address] [place], [place], [address] Phone: [phone]   Fax:  [phone]  Physical Therapy Treatment/10th Visit Progress Note  Patient Details  Name: Wayne Dudley MRN: [id number] Date of Birth: 06-26-1923 Referring Provider (PT): [name], MD  10th Visit Physical Therapy Progress Note  Dates of Reporting Period: 02/27/20 to 05/01/20   Encounter Date: 05/01/2020   PT End of Session - 05/01/20 1157    Visit Number 20    Number of Visits 30   plus 15 visits from [place]   Date for PT Re-Evaluation 29/47/65   90 day cert but 60 day plan of care   Authorization Type VA - 15 authorized visits in 120 days (use by 07/23/20)    Authorization - Visit Number 5    Authorization - Number of Visits 15    PT Start Time 4650    PT Stop Time 1143    PT Time Calculation (min) 40 min    Equipment Utilized During Treatment Gait belt    Activity Tolerance Patient tolerated treatment well    Behavior During Therapy [facility] for tasks assessed/performed           No past medical history on file.  No past surgical history on file.  There were no vitals filed for this visit.   Subjective Assessment - 05/01/20 1105    Subjective No new issues. Exercises at home are getting easier.    Pertinent History footpain 2/2 to frostbite injury, CKD, hard of hearing.    How long can you stand comfortably? can stand for about 1-2 minutes before having pain (in the feet and left ankle)    Diagnostic tests MRI of lumbar spine 11/10/19: multilevel degenerative changes, severe spinal canal stenosis at L3-L4 and L5-S1    Patient Stated Goals wants to get back to where he can do for himself.    Currently in Pain? Yes    Pain Score 5     Pain Location Ankle   and foot   Pain Orientation Left    Pain Descriptors / Indicators Burning    Pain Type Chronic pain    Pain Onset More than a month ago                              [facility] Adult PT Treatment/Exercise - 05/01/20 1118      Transfers   Transfers Sit to Stand;Stand to Sit    Sit to Stand 4: Min guard;4: Min assist    Sit to Stand Details Verbal cues for technique    Stand to Sit 4: Min guard;4: Min assist    Stand to Sit Details (indicate cue type and reason) Verbal cues for technique    Stand to Sit Details verbal cues to reach posteriorly towards w/c     Comments 5 reps sit <> stand at mat table, cues to reach back posteriorly to slow descent, cues for eccentric control, pushes up with one hand from mat table and one hand on RW      Ambulation/Gait   Ambulation/Gait Yes    Ambulation/Gait Assistance 3: Mod assist;2: Max assist    Ambulation/Gait Assistance Details pt given verbal and tactile cues for upright posture and to straighten legs during stance. wheelchair follow for safety. seated rest break between each bout of gait, cues for smaller step length on LLE. pt with more flexed posture and B knee flexion  towards end of 2nd bout     Ambulation Distance (Feet) 13 Feet   x1, 11 x 1   Assistive device Rolling walker    Gait Pattern Step-to pattern;Decreased step length - right;Decreased step length - left;Decreased dorsiflexion - right;Decreased dorsiflexion - left;Right flexed knee in stance;Left flexed knee in stance;Step-through pattern;Trunk flexed    Ambulation Surface Level;Indoor    Pre-Gait Activities standing at RW at edge of mat for 2 minutes at 52 seconds, cues to relax shoulders and for quad/glute activation for upright posture, min guard/CGA for balance             Access Code: [email or web address] URL: [email or web address] Date: 05/01/2020 Prepared by: Janann August  Bolded are new additions to HEP, reviewed today:   Exercises Seated Hip Abduction with Resistance - 2 x daily - 5 x weekly - 2 sets - 10 reps - with red theraband  Seated Hip Adduction Isometrics with Ball - 2 x daily - 5 x  weekly - 2 sets - 10 reps Supine Gluteal Sets - 2 x daily - 5 x weekly - 2 sets - 10 reps Seated Heel Slide - 2 x daily - 5 x weekly - 2 sets - 10 reps Supine Hip and Knee Flexion PROM with Caregiver - 2 x daily - 5 x weekly - 3 sets - 20 hold Supine Ankle Dorsiflexion Stretch with Caregiver - 2 x daily - 5 x weekly - 3 sets - 20 hold Seated Scapular Retraction - 1-2 x daily - 5 x weekly - 2 sets - 10 reps Sitting Knee Extension with Resistance - 1 x daily - 5 x weekly - 2 sets - 5 reps - with red theraband, cues to hold for 3 seconds         PT Education - 05/01/20 1156    Education Details new additions to HEP    Person(s) Educated Patient;Child(ren)    Methods Explanation;Demonstration;Handout    Comprehension Verbalized understanding;Returned demonstration            PT Short Term Goals - 05/01/20 1113      PT SHORT TERM GOAL #1   Title Pt and pt's son/daughter will be independent in initial HEP for LE strengthening/ROM. ALL STGS DUE 02/14/20    Baseline Pt and son have been performing intial HEP    Time 5   due to delay in scheduling   Period Weeks    Status Achieved    Target Date 02/14/20   due to delay in scheduling     PT SHORT TERM GOAL #2   Title Pt will perform sit <> stand with min guard with RW in order to improve functional LE strength.    Baseline min guard for sit <> stand at RW from w/c and mat table.    Time 4    Period Weeks    Status Achieved    Target Date 05/08/20      PT SHORT TERM GOAL #3   Title Pt and pt's son/daughter will verbalize performing pressure relief at home in order to decr risk of pressure sores due to pt spending incr time in w/c and lying in bed.    Baseline Pt and son have been instructed in pressure relief.    Time 5    Period Weeks    Status Achieved      PT SHORT TERM GOAL #4   Title Pt will be able to stand for at least 3 minutes at RW with min  guard in order to improve tolerance for ADLs.    Baseline 2 min 52 sec at RW  CGA/min guard.    Time 4    Period Weeks    Status Partially Met    Target Date 05/08/20      PT SHORT TERM GOAL #5   Title Pt will ambulate 10' mod assist with RW for improved mobility.    Baseline 13' max/mod assist with RW ON 05/01/20    Time 4    Period Weeks    Status Partially Met    Target Date 05/08/20              PT Long Term Goals - 04/08/20 1523      PT LONG TERM GOAL #1   Title Pt and pt's son/daughter will be independent in final HEP for LE strengthening/ROM.    Baseline PT continues to add to HEP. Have been working a lot of standing with son at home.    Time 9    Period Weeks    Status On-going    Target Date 06/07/20      PT LONG TERM GOAL #2   Title Pt will be able to perform squat pivot vs. stand pivot transfers with RW from w/c to mat table with supervision in order to decr caregiver burden.    Baseline supervision with squat-pivot mat to w/c but max assist with stand pivot with RW having just attempted    Time 9    Period Weeks    Status On-going    Target Date 06/07/20      PT LONG TERM GOAL #3   Title Pt will perform sit <> stand from w/c and mat table with RW with supervision in order to decr caregiver burden.    Baseline min assist sit to stand at RW from w/c and mat    Time 9    Period Weeks    Status On-going    Target Date 06/07/20      PT LONG TERM GOAL #4   Title Pt will tolerate standing activities at [place] for at least 6 minutes in order to improve tolerance for ADLs.    Baseline 2 min 36 sec at RW on 03/07/20    Time 9    Period Weeks    Status On-going    Target Date 06/07/20      PT LONG TERM GOAL #5   Title Pt will ambulate 25' with RW min assist for improved mobility in home with son's assist.    Baseline 6' max assist    Time 9    Period Weeks    Status New    Target Date 06/07/20                 Plan - 05/01/20 1211    Clinical Impression Statement 10th visit progress note: Focus of today's skilled session was  assessing pt's STGs for progress note. Pt able to stand for 2 min and 52 seconds with RW at edge of mat table with min guard, did not meet 3 minute goal, but pt needing less assist to remain standing, pt just needing tactile and occasional verbal cues for posture and quad/glute activation. Pt able to perform sit <> stand from mat table with min guard, however when performing from w/c pt needs min A when fatigued to stand and for eccentric control back into chair at end of bout of gait. Pt able to ambulate a max of 13' today  with RW with wheelchair follow with mod/max A, pt still demonstrates incr forward flexed posture when fatigued. Remainder of session focused on beginning to upgrade pt's HEP. Pt is making great progress with PT and will continue to benefit from skilled PT to improve functional mobility and independence.    Personal Factors and Comorbidities Age;Past/Current Experience    Examination-Activity Limitations Bed Mobility;Stand;Transfers;Locomotion Level;Hygiene/Grooming;Bathing;Toileting    Examination-Participation Restrictions Community Activity;Cleaning;Meal Prep    Stability/Clinical Decision Making Evolving/Moderate complexity    Rehab Potential Fair    PT Frequency 2x / week    PT Duration --   9 weeks (2 visits left out of initial 15 [place] auth and requesting 15 more)   PT Treatment/Interventions Gait training;Therapeutic exercise;Neuromuscular re-education;Therapeutic activities;ADLs/Self Care Home Management;Patient/family education;Passive range of motion    PT Next Visit Plan new additions to HEP? any other exercises to to add? sit <> stands from  mat table and w/c with RW, standing balance at walker, pre-gait and gait with RW. Will need w/c follow. core/trunk/postural strengthening. SciFit in w/c    Consulted and Agree with Plan of Care Patient;Family member/caregiver    Family Member Consulted pt's son Jeneen Rinks           Patient will benefit from skilled therapeutic  intervention in order to improve the following deficits and impairments:  Decreased balance, Decreased activity tolerance, Decreased coordination, Decreased knowledge of use of DME, Decreased mobility, Decreased range of motion, Difficulty walking, Decreased strength, Impaired flexibility, Postural dysfunction, Impaired sensation, Pain  Visit Diagnosis: Other abnormalities of gait and mobility  Muscle weakness (generalized)  Unsteadiness on feet  Abnormal posture  Other symptoms and signs involving the nervous system     Problem List There are no problems to display for this patient.   Arliss Journey, PT, DPT  05/01/2020, 12:11 PM  [facility] [address] [place], [place], [address] Phone: [phone]   Fax:  [phone]  Name: Wayne Dudley MRN: [id number] Date of Birth: [birth year]

## 2020-05-06 ENCOUNTER — Ambulatory Visit: Payer: No Typology Code available for payment source | Admitting: Occupational Therapy

## 2020-05-06 ENCOUNTER — Ambulatory Visit: Payer: No Typology Code available for payment source | Admitting: Physical Therapy

## 2020-05-06 ENCOUNTER — Encounter: Payer: Self-pay | Admitting: Occupational Therapy

## 2020-05-06 ENCOUNTER — Other Ambulatory Visit: Payer: Self-pay

## 2020-05-06 DIAGNOSIS — M6281 Muscle weakness (generalized): Secondary | ICD-10-CM

## 2020-05-06 DIAGNOSIS — R2689 Other abnormalities of gait and mobility: Secondary | ICD-10-CM

## 2020-05-06 DIAGNOSIS — R29818 Other symptoms and signs involving the nervous system: Secondary | ICD-10-CM

## 2020-05-06 DIAGNOSIS — R2681 Unsteadiness on feet: Secondary | ICD-10-CM

## 2020-05-06 DIAGNOSIS — G8929 Other chronic pain: Secondary | ICD-10-CM

## 2020-05-06 DIAGNOSIS — R293 Abnormal posture: Secondary | ICD-10-CM

## 2020-05-06 DIAGNOSIS — R208 Other disturbances of skin sensation: Secondary | ICD-10-CM

## 2020-05-06 NOTE — Therapy (Signed)
[facility] [address] [place], [place], [address] Phone: [phone]   Fax:  [phone]  Occupational Therapy Evaluation  Patient Details  Name: Wayne PangWilliam Jackson Lipson MRN: [id number] Date of Birth: 10/26/1923 Referring Provider (OT): Carrington ClampJoseph Zanga   Encounter Date: 05/06/2020   OT End of Session - 05/06/20 1445    Visit Number 1    Number of Visits 15    Authorization Type VA - Auth 15 VISITS    Authorization Time Period 1    Authorization - Visit Number 1    Authorization - Number of Visits 15    OT Start Time 1315    OT Stop Time 1400    OT Time Calculation (min) 45 min    Activity Tolerance Patient tolerated treatment well    Behavior During Therapy [facility] for tasks assessed/performed           History reviewed. No pertinent past medical history.  History reviewed. No pertinent surgical history.  There were no vitals filed for this visit.   Subjective Assessment - 05/06/20 1325    Subjective  I have pain in my right shoulder    Patient is accompanied by: Family member    Pertinent History HTN, HBP, CKD2, CATARACT REMOVAL 2018, STENOSIS    Currently in Pain? Yes    Pain Score 5     Pain Location Foot    Pain Orientation Left    Pain Descriptors / Indicators Burning    Pain Type Chronic pain             [facility] OT Assessment - 05/06/20 0001      Assessment   Medical Diagnosis Spinal Stenosis    Referring Provider (OT) Carrington ClampJoseph Zanga    Onset Date/Surgical Date 10/09/19    Hand Dominance Right    Prior Therapy PT Legs current      Precautions   Precautions Fall      Restrictions   Weight Bearing Restrictions No      Prior Function   Level of Independence Independent with basic ADLs;Independent with homemaking with wheelchair    Vocation Retired    [employer] Require[employer] retired veteran    Leisure watching sports      ADL   Eating/Feeding Needs assist with cutting food    Grooming Shaving    and putting toothpaste on toothbrush   Upper Body Bathing Minimal assistance    Lower Body Bathing Moderate assistance    Upper Body Dressing Increased time    Lower Body Dressing Moderate assistance    Toilet Transfer Moderate assistance    Toileting - Clothing Manipulation Moderate assistance    Toileting -  Art therapistHygiene Modified Independent    Tub/Shower Transfer Moderate assistance      Written Expression   Dominant Hand Right      Vision - History   Baseline Vision Bifocals    Visual History Cataracts      Vision Assessment   Eye Alignment Within Functional Limits      Cognition   Overall Cognitive Status Within Functional Limits for tasks assessed    Cognition Comments Hard of hearing      Posture/Postural Control   Posture/Postural Control Postural limitations    Postural Limitations Decreased lumbar lordosis;Decreased thoracic kyphosis    Posture Comments Limited lumbar mobility      Sensation   Light Touch Impaired Detail   FROSTBITE   Additional Comments h/o frostbite affecting fingertips and feet  Coordination   Finger Nose Finger Test Mild dysmetria, undershoots LUE    9 Hole Peg Test Right;Left    Right 9 Hole Peg Test 47.28    Left 9 Hole Peg Test 47.87      Perception   Perception Within Functional Limits      Praxis   Praxis Intact      ROM / Strength   AROM / PROM / Strength AROM;Strength      AROM   Overall AROM  Deficits    AROM Assessment Site Shoulder    Right/Left Shoulder Right;Left    Right Shoulder Flexion 145 Degrees    Left Shoulder Flexion 130 Degrees      Strength   Overall Strength Within functional limits for tasks performed    Strength Assessment Site Shoulder    Right/Left Shoulder Right;Left    Right Shoulder Flexion 4-/5    Left Shoulder Flexion 4-/5      Hand Function   Right Hand Gross Grasp Impaired    Right Hand Grip (lbs) 59    Right Hand Lateral Pinch 12 lbs    Left Hand Gross Grasp Functional    Left Hand Grip  (lbs) 57.3    Left Hand Lateral Pinch 18 lbs                           OT Education - 05/06/20 1445    Education Details Evaluation findings and potential goals    Person(s) Educated Patient;Child(ren)    Methods Explanation    Comprehension Verbalized understanding            OT Short Term Goals - 05/06/20 1550      OT SHORT TERM GOAL #1   Title Patient will complete a home exercise program for BUE with set up assistance    Baseline No current UE HEP    Time 4    Period Weeks    Status New    Target Date 06/20/20      OT SHORT TERM GOAL #2   Title Patient will dress his lower body with min assist    Baseline Mod assist    Time 4    Period Weeks    Status New      OT SHORT TERM GOAL #3   Title Patient will put toothpaste on toothbrush with supervision assist    Baseline min assist    Time 4    Period Weeks    Status New      OT SHORT TERM GOAL #4   Title Patient will transfer to toilet with minimal assist    Baseline mod assist    Time 4    Period Weeks    Status New             OT Long Term Goals - 05/06/20 1553      OT LONG TERM GOAL #1   Title Patient will complete updated HEP independently    Time 8    Period Weeks    Status New    Target Date 07/20/20      OT LONG TERM GOAL #2   Title Patient will dress lower body with set up assistance and supervision    Time 8    Period Weeks    Status New      OT LONG TERM GOAL #3   Title Patient will transfer to toilet with supervision    Time 8  Period Weeks    Status New      OT LONG TERM GOAL #4   Title Patient will tansfer to tub bench with min assistance, then shower with modified independence    Time 8    Period Weeks    Status New      OT LONG TERM GOAL #5   Title Patient will shave himself with modified independence    Time 8    Period Weeks    Status New                 Plan - 05/06/20 1446    Clinical Impression Statement 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  Patient is now wheelchiar bound, and receiving physical therapy services at this clinic to improve functional mobility status.  Patient requires mod assist with basic self care skills currenlty due to LE weakness, decreased dexterity in fingertips, decreased coordiantion, UE strength, and overall activity tolerance.  Patient will benefit from skilled OT intervention to decrease level of assistance with basic self care skills as family is very interested in preserving patients dignity.  Patient has excellent family support.    OT Occupational Profile and History Detailed Assessment- Review of Records and additional review of physical, cognitive, psychosocial history related to current functional performance    Occupational performance deficits (Please refer to evaluation for details): ADL's;IADL's    Body Structure / Function / Physical Skills ADL;Coordination;Endurance;GMC;UE functional use;Sensation;Decreased knowledge of precautions;Balance;Body mechanics;Decreased knowledge of use of DME;Flexibility;IADL;Pain;Strength;FMC;Dexterity;Cardiopulmonary status limiting activity;Gait;Mobility;ROM    Rehab Potential Good    Clinical Decision Making Several treatment options, min-mod task modification necessary    Comorbidities Affecting Occupational Performance: May have comorbidities impacting occupational performance    Modification or Assistance to Complete Evaluation  Min-Moderate modification of tasks or assist with assess necessary to complete eval    OT Frequency 2x / week    OT Duration 8 weeks   15 visits   OT Treatment/Interventions Self-care/ADL training;Therapeutic exercise;Patient/family education;Neuromuscular education;Aquatic Therapy;Fluidtherapy;Building services engineer;Therapeutic activities;Balance training;Manual Therapy;DME and/or AE  instruction    Plan Begin HEP for coordiantion, hand strength, shoulder range of motion bilateral    Consulted and Agree with Plan of Care Patient;Family member/caregiver    Family Member Consulted son [name]           Patient will benefit from skilled therapeutic intervention in order to improve the following deficits and impairments:   Body Structure / Function / Physical Skills: ADL, Coordination, Endurance, GMC, UE functional use, Sensation, Decreased knowledge of precautions, Balance, Body mechanics, Decreased knowledge of use of DME, Flexibility, IADL, Pain, Strength, FMC, Dexterity, Cardiopulmonary status limiting activity, Gait, Mobility, ROM       Visit Diagnosis: Muscle weakness (generalized) - Plan: Ot plan of care cert/re-cert  Unsteadiness on feet - Plan: Ot plan of care cert/re-cert  Abnormal posture - Plan: Ot plan of care cert/re-cert  Other symptoms and signs involving the nervous system - Plan: Ot plan of care cert/re-cert  Other disturbances of skin sensation - Plan: Ot plan of care cert/re-cert  Chronic right shoulder pain - Plan: Ot plan of care cert/re-cert    Problem List There are no problems to display for this patient.   Collier Salina, OTR/L 05/06/2020, 3:58 PM  [facility] [address] [place], [place], [address] Phone:  [phone]   Fax:  [phone]  Name: Taeshawn Helfman MRN: [id number] Date of Birth: [birth year]

## 2020-05-06 NOTE — Therapy (Signed)
[facility] [address] [place], [place], [address] Phone: [phone]   Fax:  [phone]  Physical Therapy Treatment  Patient Details  Name: Wayne Dudley MRN: [id number] Date of Birth: 1923-09-28 Referring Provider (PT): [name], MD   Encounter Date: 05/06/2020   PT End of Session - 05/06/20 1334    Visit Number 21    Number of Visits 30   plus 15 visits from [place]   Date for PT Re-Evaluation 47/09/62   90 day cert but 60 day plan of care   Authorization Type VA - 15 authorized visits in 120 days (use by 07/23/20)    Authorization - Visit Number 6    Authorization - Number of Visits 15    PT Start Time 8366    PT Stop Time 1232    PT Time Calculation (min) 44 min    Equipment Utilized During Treatment Gait belt    Activity Tolerance Patient tolerated treatment well    Behavior During Therapy [facility] for tasks assessed/performed           No past medical history on file.  No past surgical history on file.  There were no vitals filed for this visit.   Subjective Assessment - 05/06/20 1150    Subjective Nothing new. Has his OT eval today.    Pertinent History footpain 2/2 to frostbite injury, CKD, hard of hearing.    How long can you stand comfortably? can stand for about 1-2 minutes before having pain (in the feet and left ankle)    Diagnostic tests MRI of lumbar spine 11/10/19: multilevel degenerative changes, severe spinal canal stenosis at L3-L4 and L5-S1    Patient Stated Goals wants to get back to where he can do for himself.    Currently in Pain? Yes    Pain Score 6     Pain Location Ankle   and foot   Pain Orientation Left    Pain Descriptors / Indicators Burning    Pain Type Chronic pain    Pain Onset More than a month ago                             [facility] Adult PT Treatment/Exercise - 05/06/20 1224      Bed Mobility   Bed Mobility Supine to Sit;Sit to Supine    Supine  to Sit Contact Guard/Touching assist    Sit to Supine Contact Guard/Touching assist;Minimal Assistance - Patient > 75%   for LLE     Transfers   Transfers Sit to Stand;Stand to Sit    Sit to Stand 4: Min guard;4: Min assist    Sit to Stand Details Verbal cues for technique    Sit to Stand Details (indicate cue type and reason) cues to scoot towards edge prior to standing    Stand to Sit 4: Min guard;4: Min assist    Stand to Sit Details (indicate cue type and reason) Verbal cues for technique;Verbal cues for sequencing;Verbal cues for safe use of DME/AE    Stand to Sit Details verbal cues to reach posteriorly towards mat and wheelchair    Squat Pivot Transfers 5: Supervision    Squat Pivot Transfer Details (indicate cue type and reason) to and from mat table    Comments performed x4 reps of sit <> stands with RW with standing bouts for 30 seconds each, cues for posture and glute/quad activation  Ambulation/Gait   Ambulation/Gait Yes    Ambulation/Gait Assistance 3: Mod assist    Ambulation/Gait Assistance Details wheelchair follow for safety, seated rest break taken in between each bout. needs verbal and tactile cues for posture and glute/quad activation. pt needing verbal cues for R foot placement at times. pt needing assist for RW navigation when performing around slight turns    Ambulation Distance (Feet) 15 Feet   x1, 18 x 1    Assistive device Rolling walker    Gait Pattern Step-to pattern;Decreased step length - right;Decreased step length - left;Decreased dorsiflexion - right;Decreased dorsiflexion - left;Right flexed knee in stance;Left flexed knee in stance;Step-through pattern;Trunk flexed    Ambulation Surface Level;Indoor      Exercises   Exercises Other Exercises    Other Exercises  seated hamstring stretch 3 x 30 seconds B, needing verbal and demo cues for proper technique and therapist helping to assist LLE get into position, cues to not overstretch into pain. Off edge of  mat table (therapist providing min/mod A for scooting hips towards edge) - hip flexor/quad stretch 3 x 60 second bouts B, with therapist providing support for pt's leg in position, opposite leg bent with use of bolster from preventing incr hip ABD      Knee/Hip Exercises: Aerobic   Other Aerobic SciFit x4 minutes with BLE and BUE for strengthening, ROM, activity tolerance, use of blocks posteriorly behind wheels for safety                    PT Short Term Goals - 05/01/20 1113      PT SHORT TERM GOAL #1   Title Pt and pt's son/daughter will be independent in initial HEP for LE strengthening/ROM. ALL STGS DUE 02/14/20    Baseline Pt and son have been performing intial HEP    Time 5   due to delay in scheduling   Period Weeks    Status Achieved    Target Date 02/14/20   due to delay in scheduling     PT SHORT TERM GOAL #2   Title Pt will perform sit <> stand with min guard with RW in order to improve functional LE strength.    Baseline min guard for sit <> stand at RW from w/c and mat table.    Time 4    Period Weeks    Status Achieved    Target Date 05/08/20      PT SHORT TERM GOAL #3   Title Pt and pt's son/daughter will verbalize performing pressure relief at home in order to decr risk of pressure sores due to pt spending incr time in w/c and lying in bed.    Baseline Pt and son have been instructed in pressure relief.    Time 5    Period Weeks    Status Achieved      PT SHORT TERM GOAL #4   Title Pt will be able to stand for at least 3 minutes at RW with min guard in order to improve tolerance for ADLs.    Baseline 2 min 52 sec at RW CGA/min guard.    Time 4    Period Weeks    Status Partially Met    Target Date 05/08/20      PT SHORT TERM GOAL #5   Title Pt will ambulate 10' mod assist with RW for improved mobility.    Baseline 13' max/mod assist with RW ON 05/01/20    Time 4  Period Weeks    Status Partially Met    Target Date 05/08/20             PT  Long Term Goals - 04/08/20 1523      PT LONG TERM GOAL #1   Title Pt and pt's son/daughter will be independent in final HEP for LE strengthening/ROM.    Baseline PT continues to add to HEP. Have been working a lot of standing with son at home.    Time 9    Period Weeks    Status On-going    Target Date 06/07/20      PT LONG TERM GOAL #2   Title Pt will be able to perform squat pivot vs. stand pivot transfers with RW from w/c to mat table with supervision in order to decr caregiver burden.    Baseline supervision with squat-pivot mat to w/c but max assist with stand pivot with RW having just attempted    Time 9    Period Weeks    Status On-going    Target Date 06/07/20      PT LONG TERM GOAL #3   Title Pt will perform sit <> stand from w/c and mat table with RW with supervision in order to decr caregiver burden.    Baseline min assist sit to stand at RW from w/c and mat    Time 9    Period Weeks    Status On-going    Target Date 06/07/20      PT LONG TERM GOAL #4   Title Pt will tolerate standing activities at [place] for at least 6 minutes in order to improve tolerance for ADLs.    Baseline 2 min 36 sec at RW on 03/07/20    Time 9    Period Weeks    Status On-going    Target Date 06/07/20      PT LONG TERM GOAL #5   Title Pt will ambulate 25' with RW min assist for improved mobility in home with son's assist.    Baseline 6' max assist    Time 9    Period Weeks    Status New    Target Date 06/07/20                 Plan - 05/06/20 1336    Clinical Impression Statement Focus of today's skilled session was stretching for pt's B hamstrings and hip flexors/quads prior to gait training. Pt able to demonstrate improved upright posture and B extensor activation today with verbal and tactile cues. Pt continues to need mod A for gait with RW and wheelchair follow for safety. Pt at times with difficulty with RLE foot placement, needing verbal cues. Will continue to progress towards  LTGs.    Personal Factors and Comorbidities Age;Past/Current Experience    Examination-Activity Limitations Bed Mobility;Stand;Transfers;Locomotion Level;Hygiene/Grooming;Bathing;Toileting    Examination-Participation Restrictions Community Activity;Cleaning;Meal Prep    Stability/Clinical Decision Making Evolving/Moderate complexity    Rehab Potential Fair    PT Frequency 2x / week    PT Duration --   9 weeks (2 visits left out of initial 15 [place] auth and requesting 15 more)   PT Treatment/Interventions Gait training;Therapeutic exercise;Neuromuscular re-education;Therapeutic activities;ADLs/Self Care Home Management;Patient/family education;Passive range of motion    PT Next Visit Plan standing weight shifting. new additions to HEP? any other exercises to to add? sit <> stands from  mat table and w/c with RW, standing balance at walker, pre-gait and gait with RW. Will need w/c  follow. core/trunk/postural strengthening. SciFit in w/c    Consulted and Agree with Plan of Care Patient;Family member/caregiver    Family Member Consulted pt's son Jeneen Rinks           Patient will benefit from skilled therapeutic intervention in order to improve the following deficits and impairments:  Decreased balance, Decreased activity tolerance, Decreased coordination, Decreased knowledge of use of DME, Decreased mobility, Decreased range of motion, Difficulty walking, Decreased strength, Impaired flexibility, Postural dysfunction, Impaired sensation, Pain  Visit Diagnosis: Other abnormalities of gait and mobility  Muscle weakness (generalized)  Unsteadiness on feet  Abnormal posture  Other symptoms and signs involving the nervous system     Problem List There are no problems to display for this patient.   Arliss Journey, PT, DPT  05/06/2020, 1:46 PM  [facility] [address] [place], [place], [address] Phone: [phone]   Fax:   [phone]  Name: Yi Haugan MRN: [id number] Date of Birth: 03/26/1923

## 2020-05-08 ENCOUNTER — Encounter: Payer: Self-pay | Admitting: Physical Therapy

## 2020-05-08 ENCOUNTER — Ambulatory Visit: Payer: No Typology Code available for payment source | Admitting: Physical Therapy

## 2020-05-08 ENCOUNTER — Other Ambulatory Visit: Payer: Self-pay

## 2020-05-08 DIAGNOSIS — R208 Other disturbances of skin sensation: Secondary | ICD-10-CM

## 2020-05-08 DIAGNOSIS — M6281 Muscle weakness (generalized): Secondary | ICD-10-CM

## 2020-05-08 DIAGNOSIS — R2681 Unsteadiness on feet: Secondary | ICD-10-CM

## 2020-05-08 DIAGNOSIS — R29818 Other symptoms and signs involving the nervous system: Secondary | ICD-10-CM

## 2020-05-08 DIAGNOSIS — R2689 Other abnormalities of gait and mobility: Secondary | ICD-10-CM | POA: Diagnosis not present

## 2020-05-08 DIAGNOSIS — R293 Abnormal posture: Secondary | ICD-10-CM

## 2020-05-08 NOTE — Therapy (Signed)
[facility] [address] [place], [place], [address] Phone: [phone]   Fax:  [phone]  Physical Therapy Treatment  Patient Details  Name: Wayne Dudley MRN: [id number] Date of Birth: May 18, 1923 Referring Provider (PT): [name], MD   Encounter Date: 05/08/2020   PT End of Session - 05/08/20 1101    Visit Number 22    Number of Visits 30   plus 15 visits from [place]   Date for PT Re-Evaluation 25/63/89   90 day cert but 60 day plan of care   Authorization Type VA - 15 authorized visits in 120 days (use by 07/23/20)    Authorization - Visit Number 7    Authorization - Number of Visits 15    PT Start Time 1016    PT Stop Time 1058    PT Time Calculation (min) 42 min    Equipment Utilized During Treatment Gait belt    Activity Tolerance Patient tolerated treatment well    Behavior During Therapy [facility] for tasks assessed/performed           History reviewed. No pertinent past medical history.  History reviewed. No pertinent surgical history.  There were no vitals filed for this visit.   Subjective Assessment - 05/08/20 1018    Subjective No changes. L foot is hurting a little more because of the weather.    Pertinent History footpain 2/2 to frostbite injury, CKD, hard of hearing.    How long can you stand comfortably? can stand for about 1-2 minutes before having pain (in the feet and left ankle)    Diagnostic tests MRI of lumbar spine 11/10/19: multilevel degenerative changes, severe spinal canal stenosis at L3-L4 and L5-S1    Patient Stated Goals wants to get back to where he can do for himself.    Currently in Pain? Yes    Pain Score 5     Pain Location Foot    Pain Orientation Left    Pain Descriptors / Indicators Burning    Pain Type Chronic pain    Pain Onset More than a month ago                             [facility] Adult PT Treatment/Exercise - 05/08/20 0001       Ambulation/Gait   Ambulation/Gait Yes    Ambulation/Gait Assistance 3: Mod assist    Ambulation/Gait Assistance Details wheelchair follow for safety, seated rest break taken in between each bout. tactile and verbal cues for glute and quad activation and cues for a wider BOS at times. pt with improved posture during 2nd bout of gait during terminal stance and swing for improve foot clearance. Cues to reach posteriorly for w/c with RUE at end of bout of gait and needing min A for sit <> stand due to decr eccentric control when fatigued.    Ambulation Distance (Feet) 15 Feet   x1, 20' x 1   Assistive device Rolling walker    Gait Pattern Step-to pattern;Decreased step length - right;Decreased step length - left;Decreased dorsiflexion - right;Decreased dorsiflexion - left;Right flexed knee in stance;Left flexed knee in stance;Step-through pattern;Trunk flexed    Ambulation Surface Level;Indoor    Pre-Gait Activities standing on outside of // bars with chair behind for safety: standing for approx. 2 minutes with static standing and lateral weight shifting, min guard/min A, with verbal and tactile cues for posture and extensor activation. 2 x  5 reps mini squats with verbal and tactile cues for extensor activation. verbally added to pt's HEP (discussed with pt's son). x6 reps B side step out with RLE and then side step out with LLE, needing min A for placement with LLE and for balance. Wheelchair posteriorly for all exercises for safety.      Knee/Hip Exercises: Aerobic   Other Aerobic Sci-Fit x 5 min level 3.0 with BUE and BLE for strengthening, ROM, and activity tolerance.Wedges placed behind w/c for safety.                   PT Education - 05/08/20 1054    Education Details verbally added standing mini squats to pt's HEP (to perform when standing at countertop)    Person(s) Educated Patient;Child(ren)    Methods Explanation;Demonstration    Comprehension Verbalized understanding;Returned  demonstration            PT Short Term Goals - 05/01/20 1113      PT SHORT TERM GOAL #1   Title Pt and pt's son/daughter will be independent in initial HEP for LE strengthening/ROM. ALL STGS DUE 02/14/20    Baseline Pt and son have been performing intial HEP    Time 5   due to delay in scheduling   Period Weeks    Status Achieved    Target Date 02/14/20   due to delay in scheduling     PT SHORT TERM GOAL #2   Title Pt will perform sit <> stand with min guard with RW in order to improve functional LE strength.    Baseline min guard for sit <> stand at RW from w/c and mat table.    Time 4    Period Weeks    Status Achieved    Target Date 05/08/20      PT SHORT TERM GOAL #3   Title Pt and pt's son/daughter will verbalize performing pressure relief at home in order to decr risk of pressure sores due to pt spending incr time in w/c and lying in bed.    Baseline Pt and son have been instructed in pressure relief.    Time 5    Period Weeks    Status Achieved      PT SHORT TERM GOAL #4   Title Pt will be able to stand for at least 3 minutes at RW with min guard in order to improve tolerance for ADLs.    Baseline 2 min 52 sec at RW CGA/min guard.    Time 4    Period Weeks    Status Partially Met    Target Date 05/08/20      PT SHORT TERM GOAL #5   Title Pt will ambulate 10' mod assist with RW for improved mobility.    Baseline 13' max/mod assist with RW ON 05/01/20    Time 4    Period Weeks    Status Partially Met    Target Date 05/08/20             PT Long Term Goals - 04/08/20 1523      PT LONG TERM GOAL #1   Title Pt and pt's son/daughter will be independent in final HEP for LE strengthening/ROM.    Baseline PT continues to add to HEP. Have been working a lot of standing with son at home.    Time 9    Period Weeks    Status On-going    Target Date 06/07/20  PT LONG TERM GOAL #2   Title Pt will be able to perform squat pivot vs. stand pivot transfers with RW  from w/c to mat table with supervision in order to decr caregiver burden.    Baseline supervision with squat-pivot mat to w/c but max assist with stand pivot with RW having just attempted    Time 9    Period Weeks    Status On-going    Target Date 06/07/20      PT LONG TERM GOAL #3   Title Pt will perform sit <> stand from w/c and mat table with RW with supervision in order to decr caregiver burden.    Baseline min assist sit to stand at RW from w/c and mat    Time 9    Period Weeks    Status On-going    Target Date 06/07/20      PT LONG TERM GOAL #4   Title Pt will tolerate standing activities at [place] for at least 6 minutes in order to improve tolerance for ADLs.    Baseline 2 min 36 sec at RW on 03/07/20    Time 9    Period Weeks    Status On-going    Target Date 06/07/20      PT LONG TERM GOAL #5   Title Pt will ambulate 25' with RW min assist for improved mobility in home with son's assist.    Baseline 6' max assist    Time 9    Period Weeks    Status New    Target Date 06/07/20                 Plan - 05/08/20 1054    Clinical Impression Statement Focus of today's skilled session was BLE strengthening, gait training, and standing weight shifting. Pt able to demo improved posture and foot clearance during 2nd bout of gait today after verbal and tactile cues. Seated rest breaks taken as needed between activities due to fatigue. Pt is progressing well, will continue to progress towards LTGs.    Personal Factors and Comorbidities Age;Past/Current Experience    Examination-Activity Limitations Bed Mobility;Stand;Transfers;Locomotion Level;Hygiene/Grooming;Bathing;Toileting    Examination-Participation Restrictions Community Activity;Cleaning;Meal Prep    Stability/Clinical Decision Making Evolving/Moderate complexity    Rehab Potential Fair    PT Frequency 2x / week    PT Duration --   9 weeks (2 visits left out of initial 15 [place] auth and requesting 15 more)   PT  Treatment/Interventions Gait training;Therapeutic exercise;Neuromuscular re-education;Therapeutic activities;ADLs/Self Care Home Management;Patient/family education;Passive range of motion    PT Next Visit Plan standing weight shifting. new additions to HEP? any other exercises to to add? sit <> stands from  mat table and w/c with RW, standing balance at walker, pre-gait and gait with RW. Will need w/c follow. core/trunk/postural strengthening. SciFit in w/c    Consulted and Agree with Plan of Care Patient;Family member/caregiver    Family Member Consulted pt's son Jeneen Rinks           Patient will benefit from skilled therapeutic intervention in order to improve the following deficits and impairments:  Decreased balance, Decreased activity tolerance, Decreased coordination, Decreased knowledge of use of DME, Decreased mobility, Decreased range of motion, Difficulty walking, Decreased strength, Impaired flexibility, Postural dysfunction, Impaired sensation, Pain  Visit Diagnosis: Muscle weakness (generalized)  Unsteadiness on feet  Abnormal posture  Other symptoms and signs involving the nervous system  Other disturbances of skin sensation     Problem List There  are no problems to display for this patient.   Arliss Journey, PT, DPT  05/08/2020, 11:02 AM  [facility] [address] [place], [place], [address] Phone: [phone]   Fax:  [phone]  Name: Wayne Dudley MRN: [id number] Date of Birth: 1923-06-30

## 2020-05-13 ENCOUNTER — Ambulatory Visit: Payer: No Typology Code available for payment source | Admitting: Physical Therapy

## 2020-05-13 ENCOUNTER — Encounter: Payer: Self-pay | Admitting: Physical Therapy

## 2020-05-13 ENCOUNTER — Other Ambulatory Visit: Payer: Self-pay

## 2020-05-13 DIAGNOSIS — M6281 Muscle weakness (generalized): Secondary | ICD-10-CM

## 2020-05-13 DIAGNOSIS — R2681 Unsteadiness on feet: Secondary | ICD-10-CM

## 2020-05-13 DIAGNOSIS — R2689 Other abnormalities of gait and mobility: Secondary | ICD-10-CM | POA: Diagnosis not present

## 2020-05-13 DIAGNOSIS — R293 Abnormal posture: Secondary | ICD-10-CM

## 2020-05-13 DIAGNOSIS — R29818 Other symptoms and signs involving the nervous system: Secondary | ICD-10-CM

## 2020-05-13 NOTE — Therapy (Signed)
[facility] [address] [place], [place], [address] Phone: [phone]   Fax:  [phone]  Physical Therapy Treatment  Patient Details  Name: Manville Rico MRN: [id number] Date of Birth: 1923-01-30 Referring Provider (PT): [name], MD   Encounter Date: 05/13/2020   PT End of Session - 05/13/20 1144    Visit Number 23    Number of Visits 30   plus 15 visits from [place]   Date for PT Re-Evaluation 80/99/83   90 day cert but 60 day plan of care   Authorization Type VA - 15 authorized visits in 120 days (use by 07/23/20)    Authorization - Visit Number 8    Authorization - Number of Visits 15    PT Start Time 3825    PT Stop Time 1144    PT Time Calculation (min) 42 min    Equipment Utilized During Treatment Gait belt    Activity Tolerance Patient tolerated treatment well   incr BLE fatigue today   Behavior During Therapy [facility] for tasks assessed/performed           History reviewed. No pertinent past medical history.  History reviewed. No pertinent surgical history.  There were no vitals filed for this visit.   Subjective Assessment - 05/13/20 1105    Subjective Nothing new. Pain is about a 6/10 this morning in the L foot.    Pertinent History footpain 2/2 to frostbite injury, CKD, hard of hearing.    How long can you stand comfortably? can stand for about 1-2 minutes before having pain (in the feet and left ankle)    Diagnostic tests MRI of lumbar spine 11/10/19: multilevel degenerative changes, severe spinal canal stenosis at L3-L4 and L5-S1    Patient Stated Goals wants to get back to where he can do for himself.    Currently in Pain? Yes    Pain Score 6     Pain Location Ankle    Pain Orientation Left    Pain Descriptors / Indicators Burning    Pain Type Chronic pain    Pain Onset More than a month ago                             [facility] Adult PT Treatment/Exercise - 05/13/20 0001       Transfers   Transfers Sit to Stand;Stand to Sit    Sit to Stand 4: Min guard;4: Min assist    Sit to Stand Details Verbal cues for technique    Sit to Stand Details (indicate cue type and reason) cues to scoot towards edge prior to standing    Stand to Sit 4: Min guard;4: Min assist    Stand to Sit Details (indicate cue type and reason) Verbal cues for technique;Verbal cues for sequencing;Verbal cues for safe use of DME/AE    Stand to Sit Details verbal cues to reach back, pt demonstrating improved eccentric control with this today with min A after gait    Squat Pivot Transfers 5: Supervision    Squat Pivot Transfer Details (indicate cue type and reason) to and from mat table      Ambulation/Gait   Ambulation/Gait Yes    Ambulation/Gait Assistance 3: Mod assist    Ambulation/Gait Assistance Details wheelchair follow for safety - needing prolonged seated rest break between each bout. needing tactile and verbal cues for posture and B quad activation. pt more fatigued today and  needing assist with RW steering and towards end of bout of 2nd attempt, pt needing assist with LLE foot clearance    Ambulation Distance (Feet) 10 Feet   x1, 12' x 1   Assistive device Rolling walker    Gait Pattern Step-to pattern;Decreased step length - right;Decreased step length - left;Decreased dorsiflexion - right;Decreased dorsiflexion - left;Right flexed knee in stance;Left flexed knee in stance;Step-through pattern;Trunk flexed    Ambulation Surface Level;Indoor    Pre-Gait Activities standing bouts on outside of // bars: with BUE support 2 x 1 minute with weight shifting R/L with min A at times for weight shifting towards L, 3 x 1 minute bouts of standing and reaching towards cones with single UE (tapping with R hand towards cone on R and then cone on L, then repeating with L hand), needing min/mod A for balance and verbal/tactile cues for quad activation. Wheelchair posteriorly for safety and for seated rest  break between each bout of gait       Exercises   Exercises Other Exercises    Other Exercises  seated at edge of mat: with chair posteriorly with pillow, holding 1 lb medicine ball, modified sit ups with cues for slowed and controlled 2 x 5 reps, LAQs with 1 lb ankle weight on LLE x5 reps with AAROM through pt's ROM, then x10 reps with no ankle weight through pt's available ROM                     PT Short Term Goals - 05/01/20 1113      PT SHORT TERM GOAL #1   Title Pt and pt's son/daughter will be independent in initial HEP for LE strengthening/ROM. ALL STGS DUE 02/14/20    Baseline Pt and son have been performing intial HEP    Time 5   due to delay in scheduling   Period Weeks    Status Achieved    Target Date 02/14/20   due to delay in scheduling     PT SHORT TERM GOAL #2   Title Pt will perform sit <> stand with min guard with RW in order to improve functional LE strength.    Baseline min guard for sit <> stand at RW from w/c and mat table.    Time 4    Period Weeks    Status Achieved    Target Date 05/08/20      PT SHORT TERM GOAL #3   Title Pt and pt's son/daughter will verbalize performing pressure relief at home in order to decr risk of pressure sores due to pt spending incr time in w/c and lying in bed.    Baseline Pt and son have been instructed in pressure relief.    Time 5    Period Weeks    Status Achieved      PT SHORT TERM GOAL #4   Title Pt will be able to stand for at least 3 minutes at RW with min guard in order to improve tolerance for ADLs.    Baseline 2 min 52 sec at RW CGA/min guard.    Time 4    Period Weeks    Status Partially Met    Target Date 05/08/20      PT SHORT TERM GOAL #5   Title Pt will ambulate 10' mod assist with RW for improved mobility.    Baseline 13' max/mod assist with RW ON 05/01/20    Time 4    Period Weeks  Status Partially Met    Target Date 05/08/20             PT Long Term Goals - 04/08/20 1523      PT  LONG TERM GOAL #1   Title Pt and pt's son/daughter will be independent in final HEP for LE strengthening/ROM.    Baseline PT continues to add to HEP. Have been working a lot of standing with son at home.    Time 9    Period Weeks    Status On-going    Target Date 06/07/20      PT LONG TERM GOAL #2   Title Pt will be able to perform squat pivot vs. stand pivot transfers with RW from w/c to mat table with supervision in order to decr caregiver burden.    Baseline supervision with squat-pivot mat to w/c but max assist with stand pivot with RW having just attempted    Time 9    Period Weeks    Status On-going    Target Date 06/07/20      PT LONG TERM GOAL #3   Title Pt will perform sit <> stand from w/c and mat table with RW with supervision in order to decr caregiver burden.    Baseline min assist sit to stand at RW from w/c and mat    Time 9    Period Weeks    Status On-going    Target Date 06/07/20      PT LONG TERM GOAL #4   Title Pt will tolerate standing activities at RW for at least 6 minutes in order to improve tolerance for ADLs.    Baseline 2 min 36 sec at RW on 03/07/20    Time 9    Period Weeks    Status On-going    Target Date 06/07/20      PT LONG TERM GOAL #5   Title Pt will ambulate 25' with RW min assist for improved mobility in home with son's assist.    Baseline 6' max assist    Time 9    Period Weeks    Status New    Target Date 06/07/20                 Plan - 05/13/20 1433    Clinical Impression Statement Pt with increased BLE fatigue today - unable to walk more than 11' with gait today with RW. Pt demonstrating incr forward flexed posture and incr difficulty with LLE foot clearance today during gait. Seated rest breaks taken between standing and gait due to fatigue. Will continue to progress towards LTGs.    Personal Factors and Comorbidities Age;Past/Current Experience    Examination-Activity Limitations Bed Mobility;Stand;Transfers;Locomotion  Level;Hygiene/Grooming;Bathing;Toileting    Examination-Participation Restrictions Community Activity;Cleaning;Meal Prep    Stability/Clinical Decision Making Evolving/Moderate complexity    Rehab Potential Fair    PT Frequency 2x / week    PT Duration --   9 weeks (2 visits left out of initial 15 [place] auth and requesting 15 more)   PT Treatment/Interventions Gait training;Therapeutic exercise;Neuromuscular re-education;Therapeutic activities;ADLs/Self Care Home Management;Patient/family education;Passive range of motion    PT Next Visit Plan standing weight shifting. new additions to HEP? any other exercises to to add? sit <> stands from  mat table and w/c with RW, standing balance at walker, pre-gait and gait with RW. Will need w/c follow. core/trunk/postural strengthening. SciFit in w/c    Consulted and Agree with Plan of Care Patient;Family member/caregiver    Family  Member Consulted pt's son Jeneen Rinks           Patient will benefit from skilled therapeutic intervention in order to improve the following deficits and impairments:  Decreased balance, Decreased activity tolerance, Decreased coordination, Decreased knowledge of use of DME, Decreased mobility, Decreased range of motion, Difficulty walking, Decreased strength, Impaired flexibility, Postural dysfunction, Impaired sensation, Pain  Visit Diagnosis: Muscle weakness (generalized)  Unsteadiness on feet  Abnormal posture  Other symptoms and signs involving the nervous system     Problem List There are no problems to display for this patient.   Arliss Journey, PT, DPT  05/13/2020, 2:34 PM  [facility] [address] [place], [place], [address] Phone: [phone]   Fax:  [phone]  Name: Valentino Saavedra MRN: [id number] Date of Birth: [birth year]

## 2020-05-15 ENCOUNTER — Ambulatory Visit: Payer: No Typology Code available for payment source | Admitting: Physical Therapy

## 2020-05-15 ENCOUNTER — Other Ambulatory Visit: Payer: Self-pay

## 2020-05-15 DIAGNOSIS — R293 Abnormal posture: Secondary | ICD-10-CM

## 2020-05-15 DIAGNOSIS — M6281 Muscle weakness (generalized): Secondary | ICD-10-CM

## 2020-05-15 DIAGNOSIS — R2689 Other abnormalities of gait and mobility: Secondary | ICD-10-CM | POA: Diagnosis not present

## 2020-05-15 DIAGNOSIS — R2681 Unsteadiness on feet: Secondary | ICD-10-CM

## 2020-05-15 DIAGNOSIS — R29818 Other symptoms and signs involving the nervous system: Secondary | ICD-10-CM

## 2020-05-15 NOTE — Therapy (Signed)
[facility] [address] [place], [place], [address] Phone: [phone]   Fax:  [phone]  Physical Therapy Treatment  Patient Details  Name: Wayne Dudley MRN: [id number] Date of Birth: 1923/10/22 Referring Provider (PT): [name], MD   Encounter Date: 05/15/2020   PT End of Session - 05/15/20 1154    Visit Number 24    Number of Visits 30   plus 15 visits from [place]   Date for PT Re-Evaluation 99/24/26   90 day cert but 60 day plan of care   Authorization Type VA - 15 authorized visits in 120 days (use by 07/23/20)    Authorization - Visit Number 9    Authorization - Number of Visits 15    PT Start Time 1100    PT Stop Time 1142    PT Time Calculation (min) 42 min    Equipment Utilized During Treatment Gait belt    Activity Tolerance Patient tolerated treatment well   seated rest breaks as needed throughout   Behavior During Therapy [facility] for tasks assessed/performed           No past medical history on file.  No past surgical history on file.  There were no vitals filed for this visit.   Subjective Assessment - 05/15/20 1102    Subjective Reports L leg is feeling stronger today.    Pertinent History footpain 2/2 to frostbite injury, CKD, hard of hearing.    How long can you stand comfortably? can stand for about 1-2 minutes before having pain (in the feet and left ankle)    Diagnostic tests MRI of lumbar spine 11/10/19: multilevel degenerative changes, severe spinal canal stenosis at L3-L4 and L5-S1    Patient Stated Goals wants to get back to where he can do for himself.    Currently in Pain? Yes    Pain Score 6     Pain Location Ankle   and foot   Pain Orientation Left    Pain Descriptors / Indicators Burning    Pain Type Chronic pain    Pain Onset More than a month ago                             [facility] Adult PT Treatment/Exercise - 05/15/20 1111      Transfers   Transfers  Sit to Stand;Stand to Sit    Sit to Stand 4: Min guard;4: Min assist    Sit to Stand Details Verbal cues for technique    Sit to Stand Details (indicate cue type and reason) verbal cues to scoot towards edge prior to standing    Stand to Sit 4: Min guard;4: Min assist    Stand to Sit Details (indicate cue type and reason) Verbal cues for technique;Verbal cues for sequencing;Verbal cues for safe use of DME/AE    Stand to Sit Details reminder cues to reach back with single UE     Squat Pivot Transfers 5: Supervision    Squat Pivot Transfer Details (indicate cue type and reason) to and from mat table    Comments standing on outside of // bars with BUE support and w/c posteriorly: 2 x 6 reps mini squats with verbal and tactile cues for quad and glute activation and upright posture in standing, needing seated rest break between each bout      Ambulation/Gait   Ambulation/Gait Yes    Ambulation/Gait Assistance 3: Mod assist  Ambulation/Gait Assistance Details wheelchair follow - pt needing seated rest break between each bout. pt needing verbal and tactile cues for posture during gait for clearance of BLE during swing phase and verbal/tactile cues and quadriceps during stance as pt still ambulating with B knee flexion, pt demonstrating incr forward flexion as gait bout goes on. cues at times for shorter step length with LLE. pt improving with steering RW and needing no assist from PT when walking down a straightaway    Ambulation Distance (Feet) 20 Feet   x1, 19' x 1   Assistive device Rolling walker    Gait Pattern Step-to pattern;Decreased step length - right;Decreased step length - left;Decreased dorsiflexion - right;Decreased dorsiflexion - left;Right flexed knee in stance;Left flexed knee in stance;Step-through pattern;Trunk flexed    Pre-Gait Activities standing with RW at edge of mat prior to gait: x2 minutes, and then 1 minute and 22 seconds - cues for posture and quad/glute activation  throughout, min guard for safety.        Exercises   Exercises Other Exercises;Knee/Hip    Other Exercises  standing outside of // bars with BUE support with w/c posteriorly: alternating tapping foot to approx. 1-2" threshhold in // bars, needing min/mod A from therapist to place LLE on and off for incr hip/knee flexion and foot clearance and assist for weight shift to L to place R foot on threshold x3 reps B, pt with incr fatigue when performing at end of session.       Knee/Hip Exercises: Stretches   Active Hamstring Stretch Both;3 reps;30 seconds    Active Hamstring Stretch Limitations at edge of mat table, therapist assisting LLE into position, cues for technique                     PT Short Term Goals - 05/01/20 1113      PT SHORT TERM GOAL #1   Title Pt and pt's son/daughter will be independent in initial HEP for LE strengthening/ROM. ALL STGS DUE 02/14/20    Baseline Pt and son have been performing intial HEP    Time 5   due to delay in scheduling   Period Weeks    Status Achieved    Target Date 02/14/20   due to delay in scheduling     PT SHORT TERM GOAL #2   Title Pt will perform sit <> stand with min guard with RW in order to improve functional LE strength.    Baseline min guard for sit <> stand at RW from w/c and mat table.    Time 4    Period Weeks    Status Achieved    Target Date 05/08/20      PT SHORT TERM GOAL #3   Title Pt and pt's son/daughter will verbalize performing pressure relief at home in order to decr risk of pressure sores due to pt spending incr time in w/c and lying in bed.    Baseline Pt and son have been instructed in pressure relief.    Time 5    Period Weeks    Status Achieved      PT SHORT TERM GOAL #4   Title Pt will be able to stand for at least 3 minutes at RW with min guard in order to improve tolerance for ADLs.    Baseline 2 min 52 sec at RW CGA/min guard.    Time 4    Period Weeks    Status Partially Met  Target Date  05/08/20      PT SHORT TERM GOAL #5   Title Pt will ambulate 10' mod assist with RW for improved mobility.    Baseline 13' max/mod assist with RW ON 05/01/20    Time 4    Period Weeks    Status Partially Met    Target Date 05/08/20             PT Long Term Goals - 04/08/20 1523      PT LONG TERM GOAL #1   Title Pt and pt's son/daughter will be independent in final HEP for LE strengthening/ROM.    Baseline PT continues to add to HEP. Have been working a lot of standing with son at home.    Time 9    Period Weeks    Status On-going    Target Date 06/07/20      PT LONG TERM GOAL #2   Title Pt will be able to perform squat pivot vs. stand pivot transfers with RW from w/c to mat table with supervision in order to decr caregiver burden.    Baseline supervision with squat-pivot mat to w/c but max assist with stand pivot with RW having just attempted    Time 9    Period Weeks    Status On-going    Target Date 06/07/20      PT LONG TERM GOAL #3   Title Pt will perform sit <> stand from w/c and mat table with RW with supervision in order to decr caregiver burden.    Baseline min assist sit to stand at RW from w/c and mat    Time 9    Period Weeks    Status On-going    Target Date 06/07/20      PT LONG TERM GOAL #4   Title Pt will tolerate standing activities at [place] for at least 6 minutes in order to improve tolerance for ADLs.    Baseline 2 min 36 sec at RW on 03/07/20    Time 9    Period Weeks    Status On-going    Target Date 06/07/20      PT LONG TERM GOAL #5   Title Pt will ambulate 25' with RW min assist for improved mobility in home with son's assist.    Baseline 6' max assist    Time 9    Period Weeks    Status New    Target Date 06/07/20                 Plan - 05/15/20 1155    Clinical Impression Statement Pt with improvement of gait distance today compared to last session - able to ambulate 2 bouts of approx. 20' today. Pt with improvement of foot  clearance B when pt demonstrating more upright posture (needing verbal and manual cues throughout gait cycle). However, as gait bouts went on pt demonstrating incr forward flexed posture with B knee flexion. Will continue to progress towards LTGs.    Personal Factors and Comorbidities Age;Past/Current Experience    Examination-Activity Limitations Bed Mobility;Stand;Transfers;Locomotion Level;Hygiene/Grooming;Bathing;Toileting    Examination-Participation Restrictions Community Activity;Cleaning;Meal Prep    Stability/Clinical Decision Making Evolving/Moderate complexity    Rehab Potential Fair    PT Frequency 2x / week    PT Duration --   9 weeks (2 visits left out of initial 15 [place] auth and requesting 15 more)   PT Treatment/Interventions Gait training;Therapeutic exercise;Neuromuscular re-education;Therapeutic activities;ADLs/Self Care Home Management;Patient/family education;Passive range of motion  PT Next Visit Plan hamstring and hip flexor stretch. sit <> stands from  mat table and w/c with RW, standing balance at walker, pre-gait and gait with RW. Will need w/c follow. core/trunk/postural strengthening. SciFit in w/c    Consulted and Agree with Plan of Care Patient;Family member/caregiver    Family Member Consulted pt's son Jeneen Rinks           Patient will benefit from skilled therapeutic intervention in order to improve the following deficits and impairments:  Decreased balance, Decreased activity tolerance, Decreased coordination, Decreased knowledge of use of DME, Decreased mobility, Decreased range of motion, Difficulty walking, Decreased strength, Impaired flexibility, Postural dysfunction, Impaired sensation, Pain  Visit Diagnosis: Muscle weakness (generalized)  Unsteadiness on feet  Abnormal posture  Other symptoms and signs involving the nervous system     Problem List There are no problems to display for this patient.   Arliss Journey, PT, DPT  05/15/2020, 11:57  AM  [facility] [address] [place], [place], [address] Phone: [phone]   Fax:  [phone]  Name: Wayne Dudley MRN: [id number] Date of Birth: Jun 28, 1923

## 2020-05-20 ENCOUNTER — Other Ambulatory Visit: Payer: Self-pay

## 2020-05-20 ENCOUNTER — Ambulatory Visit: Payer: No Typology Code available for payment source

## 2020-05-20 DIAGNOSIS — R2689 Other abnormalities of gait and mobility: Secondary | ICD-10-CM | POA: Diagnosis not present

## 2020-05-20 DIAGNOSIS — M6281 Muscle weakness (generalized): Secondary | ICD-10-CM

## 2020-05-20 NOTE — Patient Instructions (Signed)
Access Code: [email or web address] URL: [email or web address] Date: 05/20/2020 Prepared by: Elmer Bales  Exercises Seated Hip Abduction with Resistance - 2 x daily - 5 x weekly - 2 sets - 10 reps Seated Hip Adduction Isometrics with Ball - 2 x daily - 5 x weekly - 2 sets - 10 reps Supine Gluteal Sets - 2 x daily - 5 x weekly - 2 sets - 10 reps Seated Heel Slide - 2 x daily - 5 x weekly - 2 sets - 10 reps Supine Hip and Knee Flexion PROM with Caregiver - 2 x daily - 5 x weekly - 3 sets - 20 hold Supine Ankle Dorsiflexion Stretch with Caregiver - 2 x daily - 5 x weekly - 3 sets - 20 hold Seated Scapular Retraction - 1-2 x daily - 5 x weekly - 2 sets - 10 reps Sitting Knee Extension with Resistance - 1 x daily - 5 x weekly - 2 sets - 5 reps Seated Hamstring Stretch - 3 x daily - 7 x weekly - 1 sets - 4 reps - 30 sec hold Supine Hip Flexor Stretch with Weight - 3 x daily - 7 x weekly - 1 sets - 4 reps - 30 sec hold

## 2020-05-20 NOTE — Therapy (Signed)
[facility] [address] [place], [place], [address] Phone: [phone]   Fax:  [phone]  Physical Therapy Treatment  Patient Details  Name: Wayne Dudley MRN: [id number] Date of Birth: 1923-01-04 Referring Provider (PT): [name], MD   Encounter Date: 05/20/2020   PT End of Session - 05/20/20 1317    Visit Number 25    Number of Visits 30   plus 15 visits from [place]   Date for PT Re-Evaluation 50/93/26   90 day cert but 60 day plan of care   Authorization Type VA - 15 authorized visits in 120 days (use by 07/23/20)    Authorization - Visit Number 10    Authorization - Number of Visits 15    PT Start Time 7124    PT Stop Time 1356    PT Time Calculation (min) 41 min    Equipment Utilized During Treatment Gait belt    Activity Tolerance Patient tolerated treatment well   seated rest breaks as needed throughout   Behavior During Therapy [facility] for tasks assessed/performed           History reviewed. No pertinent past medical history.  History reviewed. No pertinent surgical history.  There were no vitals filed for this visit.   Subjective Assessment - 05/20/20 1316    Subjective Pt reports doing well. No new issues.    Pertinent History footpain 2/2 to frostbite injury, CKD, hard of hearing.    How long can you stand comfortably? can stand for about 1-2 minutes before having pain (in the feet and left ankle)    Diagnostic tests MRI of lumbar spine 11/10/19: multilevel degenerative changes, severe spinal canal stenosis at L3-L4 and L5-S1    Patient Stated Goals wants to get back to where he can do for himself.    Pain Score 6     Pain Location Foot    Pain Orientation Left    Pain Descriptors / Indicators Burning    Pain Type Chronic pain    Pain Onset More than a month ago    Pain Frequency Constant                             [facility] Adult PT Treatment/Exercise - 05/20/20 1318       Transfers   Transfers Sit to Stand;Stand to Sit;Squat Pivot Transfers    Sit to Stand 4: Min guard    Sit to Stand Details Verbal cues for technique    Stand to Sit 4: Min guard    Stand to Sit Details (indicate cue type and reason) Verbal cues for technique    Stand to Sit Details Verbal cues to reach back for seat prior to sitting    Squat Pivot Transfers 5: Supervision    Squat Pivot Transfer Details (indicate cue type and reason) w/c to mat      Ambulation/Gait   Ambulation/Gait Yes    Ambulation/Gait Assistance 4: Min assist;3: Mod assist    Ambulation/Gait Assistance Details Pt was cued to stand erect and try to straighten stance knee more. Pt has more foot drop on right but did better with keeping left knee straighter to help clear. Less clearance when advancing LLE with more right knee flexion. HR=80 after    Ambulation Distance (Feet) 20 Feet   24' x 1, 19' x 1   Assistive device Rolling walker   w/c follow   Gait Pattern  Step-to pattern;Step-through pattern;Decreased step length - right;Decreased step length - left;Decreased dorsiflexion - right;Poor foot clearance - left;Poor foot clearance - right;Right flexed knee in stance;Left flexed knee in stance    Pre-Gait Activities Pt stood at walker stepping forward and back x 5 each foot. Instructed to keep knees stance leg straight when stepping. Able to keep left knee straighter today. Min assist with stepping activity      Exercises   Exercises Other Exercises    Other Exercises  Edge of mat: hamstring stretrch 30 sec x 2 each position. Pt needed some assistance to get LLE out in front for stretch. Supine hip flexor stretch off edge of mat 30 sec x 2 each position                  PT Education - 05/20/20 1402    Education Details Added stretches to HEP    Person(s) Educated Patient;Child(ren)    Methods Explanation;Demonstration;Handout    Comprehension Verbalized understanding;Returned demonstration            PT  Short Term Goals - 05/01/20 1113      PT SHORT TERM GOAL #1   Title Pt and pt's son/daughter will be independent in initial HEP for LE strengthening/ROM. ALL STGS DUE 02/14/20    Baseline Pt and son have been performing intial HEP    Time 5   due to delay in scheduling   Period Weeks    Status Achieved    Target Date 02/14/20   due to delay in scheduling     PT SHORT TERM GOAL #2   Title Pt will perform sit <> stand with min guard with RW in order to improve functional LE strength.    Baseline min guard for sit <> stand at RW from w/c and mat table.    Time 4    Period Weeks    Status Achieved    Target Date 05/08/20      PT SHORT TERM GOAL #3   Title Pt and pt's son/daughter will verbalize performing pressure relief at home in order to decr risk of pressure sores due to pt spending incr time in w/c and lying in bed.    Baseline Pt and son have been instructed in pressure relief.    Time 5    Period Weeks    Status Achieved      PT SHORT TERM GOAL #4   Title Pt will be able to stand for at least 3 minutes at RW with min guard in order to improve tolerance for ADLs.    Baseline 2 min 52 sec at RW CGA/min guard.    Time 4    Period Weeks    Status Partially Met    Target Date 05/08/20      PT SHORT TERM GOAL #5   Title Pt will ambulate 10' mod assist with RW for improved mobility.    Baseline 13' max/mod assist with RW ON 05/01/20    Time 4    Period Weeks    Status Partially Met    Target Date 05/08/20             PT Long Term Goals - 04/08/20 1523      PT LONG TERM GOAL #1   Title Pt and pt's son/daughter will be independent in final HEP for LE strengthening/ROM.    Baseline PT continues to add to HEP. Have been working a lot of standing with son at home.  Time 9    Period Weeks    Status On-going    Target Date 06/07/20      PT LONG TERM GOAL #2   Title Pt will be able to perform squat pivot vs. stand pivot transfers with RW from w/c to mat table with  supervision in order to decr caregiver burden.    Baseline supervision with squat-pivot mat to w/c but max assist with stand pivot with RW having just attempted    Time 9    Period Weeks    Status On-going    Target Date 06/07/20      PT LONG TERM GOAL #3   Title Pt will perform sit <> stand from w/c and mat table with RW with supervision in order to decr caregiver burden.    Baseline min assist sit to stand at RW from w/c and mat    Time 9    Period Weeks    Status On-going    Target Date 06/07/20      PT LONG TERM GOAL #4   Title Pt will tolerate standing activities at [place] for at least 6 minutes in order to improve tolerance for ADLs.    Baseline 2 min 36 sec at RW on 03/07/20    Time 9    Period Weeks    Status On-going    Target Date 06/07/20      PT LONG TERM GOAL #5   Title Pt will ambulate 25' with RW min assist for improved mobility in home with son's assist.    Baseline 6' max assist    Time 9    Period Weeks    Status New    Target Date 06/07/20                 Plan - 05/20/20 1403    Clinical Impression Statement Pt was able to increase gait bouts today. Pt had bilateral knee flexion with gait but able to get more extension on LLE today than right during stance.    Personal Factors and Comorbidities Age;Past/Current Experience    Examination-Activity Limitations Bed Mobility;Stand;Transfers;Locomotion Level;Hygiene/Grooming;Bathing;Toileting    Examination-Participation Restrictions Community Activity;Cleaning;Meal Prep    Stability/Clinical Decision Making Evolving/Moderate complexity    Rehab Potential Fair    PT Frequency 2x / week    PT Duration --   9 weeks (2 visits left out of initial 15 [place] auth and requesting 15 more)   PT Treatment/Interventions Gait training;Therapeutic exercise;Neuromuscular re-education;Therapeutic activities;ADLs/Self Care Home Management;Patient/family education;Passive range of motion    PT Next Visit Plan sit <> stands from   mat table and w/c with RW, standing balance at walker, pre-gait and gait with RW. Will need w/c follow. core/trunk/postural strengthening. SciFit in w/c    Consulted and Agree with Plan of Care Patient;Family member/caregiver    Family Member Consulted pt's son Jeneen Rinks           Patient will benefit from skilled therapeutic intervention in order to improve the following deficits and impairments:  Decreased balance, Decreased activity tolerance, Decreased coordination, Decreased knowledge of use of DME, Decreased mobility, Decreased range of motion, Difficulty walking, Decreased strength, Impaired flexibility, Postural dysfunction, Impaired sensation, Pain  Visit Diagnosis: Other abnormalities of gait and mobility  Muscle weakness (generalized)     Problem List There are no problems to display for this patient.   Electa Sniff, PT, DPT, NCS 05/20/2020, 2:05 PM  [facility] [address] [place],  [place], [address] Phone: [phone]   Fax:  [phone]  Name: Sione Baumgarten MRN: [id number] Date of Birth: 11-04-1922

## 2020-05-21 ENCOUNTER — Ambulatory Visit: Payer: No Typology Code available for payment source

## 2020-05-21 ENCOUNTER — Ambulatory Visit: Payer: No Typology Code available for payment source | Admitting: Occupational Therapy

## 2020-05-21 DIAGNOSIS — R2689 Other abnormalities of gait and mobility: Secondary | ICD-10-CM | POA: Diagnosis not present

## 2020-05-21 DIAGNOSIS — M6281 Muscle weakness (generalized): Secondary | ICD-10-CM

## 2020-05-21 NOTE — Therapy (Signed)
[facility] [address] [place], [place], [address] Phone: [phone]   Fax:  [phone]  Physical Therapy Treatment  Patient Details  Name: Wayne Dudley MRN: [id number] Date of Birth: 05/09/1923 Referring Provider (PT): Carrington Clamp, MD   Encounter Date: 05/21/2020   PT End of Session - 05/21/20 1233    Visit Number 26    Number of Visits 30   plus 15 visits from [place]   Date for PT Re-Evaluation 06/05/20   90 day cert but 60 day plan of care   Authorization Type VA - 15 authorized visits in 120 days (use by 07/23/20)    Authorization - Visit Number 11    Authorization - Number of Visits 15    PT Start Time 1232    PT Stop Time 1315    PT Time Calculation (min) 43 min    Equipment Utilized During Treatment Gait belt    Activity Tolerance Patient tolerated treatment well   seated rest breaks as needed throughout   Behavior During Therapy [facility] for tasks assessed/performed           History reviewed. No pertinent past medical history.  History reviewed. No pertinent surgical history.  There were no vitals filed for this visit.   Subjective Assessment - 05/21/20 1234    Subjective Pt is taking something like amoxicillin for ear infection. Not sure exactly which med. Reports doing ok.    Pertinent History footpain 2/2 to frostbite injury, CKD, hard of hearing.    How long can you stand comfortably? can stand for about 1-2 minutes before having pain (in the feet and left ankle)    Diagnostic tests MRI of lumbar spine 11/10/19: multilevel degenerative changes, severe spinal canal stenosis at L3-L4 and L5-S1    Patient Stated Goals wants to get back to where he can do for himself.    Currently in Pain? Yes    Pain Score 5     Pain Location Foot    Pain Orientation Left    Pain Descriptors / Indicators Burning    Pain Type Chronic pain    Pain Onset More than a month ago    Pain Frequency Constant                              [facility] Adult PT Treatment/Exercise - 05/21/20 1235      Transfers   Transfers Sit to Stand;Stand to Sit    Sit to Stand 4: Min guard;4: Min assist    Sit to Stand Details Verbal cues for technique    Stand to Sit 4: Min guard;4: Min assist    Stand to Sit Details (indicate cue type and reason) Verbal cues for technique      Ambulation/Gait   Ambulation/Gait Yes    Ambulation/Gait Assistance 3: Mod assist;2: Max assist    Ambulation/Gait Assistance Details Pt was cued to push through stance leg to straighten up more. Pt was more flexed with first bout and improved some with second. Does fatigue as goes on.    Ambulation Distance (Feet) 16 Feet   16' x1   Assistive device Rolling walker   w/c follow   Gait Pattern Step-to pattern      Neuro Re-ed    Neuro Re-ed Details  Standing at counter: moving 6 cones to each side x 2 with each arm. Moving cones in to overhead cabinet with right hand  and then down with left. Standing with trying to get some trunk rotation reaching to across to give PT high five on one side and then son on other x 2 each side. Standing x 1 min 30 sec at counter with working on upright posture. Pt was min/mod assist with activities with decreasing UE support and needed cues to stand more erect. Pt had increased flexion in legs with decreasing support.      Knee/Hip Exercises: Aerobic   Other Aerobic Sci Fit level 3 x 8 min. HR=68 after                    PT Short Term Goals - 05/01/20 1113      PT SHORT TERM GOAL #1   Title Pt and pt's son/daughter will be independent in initial HEP for LE strengthening/ROM. ALL STGS DUE 02/14/20    Baseline Pt and son have been performing intial HEP    Time 5   due to delay in scheduling   Period Weeks    Status Achieved    Target Date 02/14/20   due to delay in scheduling     PT SHORT TERM GOAL #2   Title Pt will perform sit <> stand with min guard with RW in order to improve  functional LE strength.    Baseline min guard for sit <> stand at RW from w/c and mat table.    Time 4    Period Weeks    Status Achieved    Target Date 05/08/20      PT SHORT TERM GOAL #3   Title Pt and pt's son/daughter will verbalize performing pressure relief at home in order to decr risk of pressure sores due to pt spending incr time in w/c and lying in bed.    Baseline Pt and son have been instructed in pressure relief.    Time 5    Period Weeks    Status Achieved      PT SHORT TERM GOAL #4   Title Pt will be able to stand for at least 3 minutes at RW with min guard in order to improve tolerance for ADLs.    Baseline 2 min 52 sec at RW CGA/min guard.    Time 4    Period Weeks    Status Partially Met    Target Date 05/08/20      PT SHORT TERM GOAL #5   Title Pt will ambulate 10' mod assist with RW for improved mobility.    Baseline 13' max/mod assist with RW ON 05/01/20    Time 4    Period Weeks    Status Partially Met    Target Date 05/08/20             PT Long Term Goals - 04/08/20 1523      PT LONG TERM GOAL #1   Title Pt and pt's son/daughter will be independent in final HEP for LE strengthening/ROM.    Baseline PT continues to add to HEP. Have been working a lot of standing with son at home.    Time 9    Period Weeks    Status On-going    Target Date 06/07/20      PT LONG TERM GOAL #2   Title Pt will be able to perform squat pivot vs. stand pivot transfers with RW from w/c to mat table with supervision in order to decr caregiver burden.    Baseline supervision with squat-pivot mat to w/c  but max assist with stand pivot with RW having just attempted    Time 9    Period Weeks    Status On-going    Target Date 06/07/20      PT LONG TERM GOAL #3   Title Pt will perform sit <> stand from w/c and mat table with RW with supervision in order to decr caregiver burden.    Baseline min assist sit to stand at RW from w/c and mat    Time 9    Period Weeks     Status On-going    Target Date 06/07/20      PT LONG TERM GOAL #4   Title Pt will tolerate standing activities at [place] for at least 6 minutes in order to improve tolerance for ADLs.    Baseline 2 min 36 sec at RW on 03/07/20    Time 9    Period Weeks    Status On-going    Target Date 06/07/20      PT LONG TERM GOAL #5   Title Pt will ambulate 25' with RW min assist for improved mobility in home with son's assist.    Baseline 6' max assist    Time 9    Period Weeks    Status New    Target Date 06/07/20                 Plan - 05/21/20 2011    Clinical Impression Statement PT continued to focus on BLE strengthening with functional activities as well as upright posture in standing with decreasing UE support. Pt has increased knee flexion in standing as fatigues.    Personal Factors and Comorbidities Age;Past/Current Experience    Examination-Activity Limitations Bed Mobility;Stand;Transfers;Locomotion Level;Hygiene/Grooming;Bathing;Toileting    Examination-Participation Restrictions Community Activity;Cleaning;Meal Prep    Stability/Clinical Decision Making Evolving/Moderate complexity    Rehab Potential Fair    PT Frequency 2x / week    PT Duration --   9 weeks (2 visits left out of initial 15 [place] auth and requesting 15 more)   PT Treatment/Interventions Gait training;Therapeutic exercise;Neuromuscular re-education;Therapeutic activities;ADLs/Self Care Home Management;Patient/family education;Passive range of motion    PT Next Visit Plan sit <> stands from  mat table and w/c with RW, standing balance at walker, pre-gait and gait with RW. Will need w/c follow. core/trunk/postural strengthening. SciFit in w/c    Consulted and Agree with Plan of Care Patient;Family member/caregiver    Family Member Consulted pt's son Jeneen Rinks           Patient will benefit from skilled therapeutic intervention in order to improve the following deficits and impairments:  Decreased balance, Decreased  activity tolerance, Decreased coordination, Decreased knowledge of use of DME, Decreased mobility, Decreased range of motion, Difficulty walking, Decreased strength, Impaired flexibility, Postural dysfunction, Impaired sensation, Pain  Visit Diagnosis: Other abnormalities of gait and mobility  Muscle weakness (generalized)     Problem List There are no problems to display for this patient.   Electa Sniff, PT, DPT, NCS 05/21/2020, 8:13 PM  [facility] [address] [place], [place], [address] Phone: [phone]   Fax:  [phone]  Name: Wayne Dudley MRN: [id number] Date of Birth: Aug 25, 1923

## 2020-05-22 ENCOUNTER — Ambulatory Visit: Payer: Non-veteran care

## 2020-05-26 ENCOUNTER — Other Ambulatory Visit: Payer: Self-pay

## 2020-05-26 ENCOUNTER — Ambulatory Visit: Payer: No Typology Code available for payment source | Admitting: Physical Therapy

## 2020-05-26 DIAGNOSIS — R2681 Unsteadiness on feet: Secondary | ICD-10-CM

## 2020-05-26 DIAGNOSIS — R2689 Other abnormalities of gait and mobility: Secondary | ICD-10-CM | POA: Diagnosis not present

## 2020-05-26 DIAGNOSIS — M6281 Muscle weakness (generalized): Secondary | ICD-10-CM

## 2020-05-26 DIAGNOSIS — R29818 Other symptoms and signs involving the nervous system: Secondary | ICD-10-CM

## 2020-05-26 DIAGNOSIS — R293 Abnormal posture: Secondary | ICD-10-CM

## 2020-05-26 NOTE — Therapy (Signed)
[facility] [address] [place], [place], [address] Phone: [phone]   Fax:  [phone]  Physical Therapy Treatment  Patient Details  Name: Wayne Dudley MRN: [id number] Date of Birth: [birth year] Referring Provider (PT): [name], MD   Encounter Date: 05/26/2020   PT End of Session - 05/26/20 1155    Visit Number 27    Number of Visits 30   plus 15 visits from [place]   Date for PT Re-Evaluation 16/07/37   90 day cert but 60 day plan of care   Authorization Type VA - 15 authorized visits in 120 days (use by 07/23/20)    Authorization - Visit Number 12    Authorization - Number of Visits 15    PT Start Time 1062    PT Stop Time 1143    PT Time Calculation (min) 41 min    Equipment Utilized During Treatment Gait belt    Activity Tolerance Patient tolerated treatment well   seated rest breaks as needed throughout   Behavior During Therapy [facility] for tasks assessed/performed           No past medical history on file.  No past surgical history on file.  There were no vitals filed for this visit.   Subjective Assessment - 05/26/20 1105    Subjective Reports that his R leg and his arms are feeling stronger. Stretches are going well at home.    Pertinent History footpain 2/2 to frostbite injury, CKD, hard of hearing.    How long can you stand comfortably? can stand for about 1-2 minutes before having pain (in the feet and left ankle)    Diagnostic tests MRI of lumbar spine 11/10/19: multilevel degenerative changes, severe spinal canal stenosis at L3-L4 and L5-S1    Patient Stated Goals wants to get back to where he can do for himself.    Currently in Pain? Yes    Pain Score 5     Pain Location Foot    Pain Orientation Left    Pain Descriptors / Indicators Burning    Pain Type Chronic pain    Pain Onset More than a month ago                             [facility] Adult PT Treatment/Exercise -  05/26/20 1116      Transfers   Transfers Sit to Stand;Stand to Sit    Sit to Stand 4: Min guard;4: Min assist    Sit to Stand Details Verbal cues for technique    Five time sit to stand comments  throughout session, plus 3 additional reps at end of session from mat table with cues to scoot out towards edge and get feet back behind him, when fatigued pt with incr difficulty performing without bracing BLE against mat    Stand to Sit 4: Min guard;4: Min assist    Stand to Sit Details (indicate cue type and reason) Verbal cues for technique    Stand to Sit Details cues to reach posteriorly and for eccentric control when lowering    Squat Pivot Transfers 5: Supervision    Squat Pivot Transfer Details (indicate cue type and reason) w/c <> mat      Ambulation/Gait   Ambulation/Gait Yes    Ambulation/Gait Assistance 3: Mod assist    Ambulation/Gait Assistance Details wheelchair follow, needing prolonged seated rest break between each bout of gait. cues for posture  and quad activation during stance phase. pt with incr difficulty clearing LLE at times, needing assist from therapist during swing and for weight shift towards R. cued for wider BOS as pt with taking more narrow steps, esp with LLE. pt with incr forward flexed posture when fatigued.     Ambulation Distance (Feet) 19 Feet   x1, 12 x 1   Assistive device Rolling walker   close w/c follow   Gait Pattern Step-to pattern;Step-through pattern;Decreased weight shift to right;Decreased dorsiflexion - left;Decreased dorsiflexion - right;Right foot flat;Left foot flat;Right flexed knee in stance;Left flexed knee in stance;Narrow base of support;Poor foot clearance - left    Pre-Gait Activities standing at RW x1 minute with cues to relax shoulders, for posture, and B glute/quad activation. x5 reps B stepping forward and back with cues for wider BOS and for quad activation in stance leg when stepping, needing min/mod A, cues for smaller step length in  order for pt to clear LLE better off of floor vs. sliding it on ground. At edge of mat table: buttock raises x10 reps with using BUE to help push off from mat and cueing for nose over toes for incr weight bearing through BLE, then with LLE staggered behind R for incr weight bearing x5 reps.  Min guard - approx. 3-5 second holds for each bout.                      PT Short Term Goals - 05/01/20 1113      PT SHORT TERM GOAL #1   Title Pt and pt's son/daughter will be independent in initial HEP for LE strengthening/ROM. ALL STGS DUE 02/14/20    Baseline Pt and son have been performing intial HEP    Time 5   due to delay in scheduling   Period Weeks    Status Achieved    Target Date 02/14/20   due to delay in scheduling     PT SHORT TERM GOAL #2   Title Pt will perform sit <> stand with min guard with RW in order to improve functional LE strength.    Baseline min guard for sit <> stand at RW from w/c and mat table.    Time 4    Period Weeks    Status Achieved    Target Date 05/08/20      PT SHORT TERM GOAL #3   Title Pt and pt's son/daughter will verbalize performing pressure relief at home in order to decr risk of pressure sores due to pt spending incr time in w/c and lying in bed.    Baseline Pt and son have been instructed in pressure relief.    Time 5    Period Weeks    Status Achieved      PT SHORT TERM GOAL #4   Title Pt will be able to stand for at least 3 minutes at RW with min guard in order to improve tolerance for ADLs.    Baseline 2 min 52 sec at RW CGA/min guard.    Time 4    Period Weeks    Status Partially Met    Target Date 05/08/20      PT SHORT TERM GOAL #5   Title Pt will ambulate 10' mod assist with RW for improved mobility.    Baseline 13' max/mod assist with RW ON 05/01/20    Time 4    Period Weeks    Status Partially Met    Target Date  05/08/20             PT Long Term Goals - 04/08/20 1523      PT LONG TERM GOAL #1   Title Pt and pt's  son/daughter will be independent in final HEP for LE strengthening/ROM.    Baseline PT continues to add to HEP. Have been working a lot of standing with son at home.    Time 9    Period Weeks    Status On-going    Target Date 06/07/20      PT LONG TERM GOAL #2   Title Pt will be able to perform squat pivot vs. stand pivot transfers with RW from w/c to mat table with supervision in order to decr caregiver burden.    Baseline supervision with squat-pivot mat to w/c but max assist with stand pivot with RW having just attempted    Time 9    Period Weeks    Status On-going    Target Date 06/07/20      PT LONG TERM GOAL #3   Title Pt will perform sit <> stand from w/c and mat table with RW with supervision in order to decr caregiver burden.    Baseline min assist sit to stand at RW from w/c and mat    Time 9    Period Weeks    Status On-going    Target Date 06/07/20      PT LONG TERM GOAL #4   Title Pt will tolerate standing activities at [place] for at least 6 minutes in order to improve tolerance for ADLs.    Baseline 2 min 36 sec at RW on 03/07/20    Time 9    Period Weeks    Status On-going    Target Date 06/07/20      PT LONG TERM GOAL #5   Title Pt will ambulate 25' with RW min assist for improved mobility in home with son's assist.    Baseline 6' max assist    Time 9    Period Weeks    Status New    Target Date 06/07/20                 Plan - 05/26/20 1157    Clinical Impression Statement Today's skilled session continued to focus on BLE strengthening and gait training with RW. Pt reporting more LLE fatigue today, needing assist at times during gait to clear LLE during swing phase. Pt with incr knee and trunk flexion when fatigued. Seated rest breaks taken throughout session. Will continue to progress towards LTGs.    Personal Factors and Comorbidities Age;Past/Current Experience    Examination-Activity Limitations Bed Mobility;Stand;Transfers;Locomotion  Level;Hygiene/Grooming;Bathing;Toileting    Examination-Participation Restrictions Community Activity;Cleaning;Meal Prep    Stability/Clinical Decision Making Evolving/Moderate complexity    Rehab Potential Fair    PT Frequency 2x / week    PT Duration --   9 weeks (2 visits left out of initial 15 [place] auth and requesting 15 more)   PT Treatment/Interventions Gait training;Therapeutic exercise;Neuromuscular re-education;Therapeutic activities;ADLs/Self Care Home Management;Patient/family education;Passive range of motion    PT Next Visit Plan request more visits from [place]? sit <> stands from  mat table and w/c with RW, standing balance at walker, pre-gait and gait with RW. Will need w/c follow. core/trunk/postural strengthening. SciFit in w/c    Consulted and Agree with Plan of Care Patient;Family member/caregiver    Family Member Consulted pt's son Jeneen Rinks  Patient will benefit from skilled therapeutic intervention in order to improve the following deficits and impairments:  Decreased balance, Decreased activity tolerance, Decreased coordination, Decreased knowledge of use of DME, Decreased mobility, Decreased range of motion, Difficulty walking, Decreased strength, Impaired flexibility, Postural dysfunction, Impaired sensation, Pain  Visit Diagnosis: Other abnormalities of gait and mobility  Muscle weakness (generalized)  Unsteadiness on feet  Abnormal posture  Other symptoms and signs involving the nervous system     Problem List There are no problems to display for this patient.   Arliss Journey, PT, DPT  05/26/2020, 11:58 AM  [facility] [address] [place], [place], [address] Phone: [phone]   Fax:  [phone]  Name: Rodrigues Urbanek MRN: [id number] Date of Birth: [birth year]

## 2020-05-28 ENCOUNTER — Other Ambulatory Visit: Payer: Self-pay

## 2020-05-28 ENCOUNTER — Ambulatory Visit: Payer: No Typology Code available for payment source | Admitting: Physical Therapy

## 2020-05-28 DIAGNOSIS — R29818 Other symptoms and signs involving the nervous system: Secondary | ICD-10-CM

## 2020-05-28 DIAGNOSIS — M6281 Muscle weakness (generalized): Secondary | ICD-10-CM

## 2020-05-28 DIAGNOSIS — R2689 Other abnormalities of gait and mobility: Secondary | ICD-10-CM | POA: Diagnosis not present

## 2020-05-28 DIAGNOSIS — R293 Abnormal posture: Secondary | ICD-10-CM

## 2020-05-28 DIAGNOSIS — R2681 Unsteadiness on feet: Secondary | ICD-10-CM

## 2020-05-28 NOTE — Therapy (Signed)
[facility] [address] [place], [place], [address] Phone: [phone]   Fax:  [phone]  Physical Therapy Treatment  Patient Details  Name: Wayne Dudley MRN: [id number] Date of Birth: 1923-06-14 Referring Provider (PT): Wayne Copa, MD   Encounter Date: 05/28/2020   PT End of Session - 05/28/20 1538    Visit Number 28    Number of Visits 30   plus 15 visits from [place]   Date for PT Re-Evaluation 21/97/58   90 day cert but 60 day plan of care   Authorization Type VA - 15 authorized visits in 120 days (use by 07/23/20)    Authorization - Visit Number 13    Authorization - Number of Visits 15    PT Start Time 8325    PT Stop Time 1530    PT Time Calculation (min) 41 min    Equipment Utilized During Treatment Gait belt    Activity Tolerance Patient tolerated treatment well   seated rest breaks as needed throughout   Behavior During Therapy [name] for tasks assessed/performed           No past medical history on file.  No past surgical history on file.  There were no vitals filed for this visit.   Subjective Assessment - 05/28/20 1234    Subjective Reports L leg is feeling stronger.    Pertinent History footpain 2/2 to frostbite injury, CKD, hard of hearing.    How long can you stand comfortably? can stand for about 1-2 minutes before having pain (in the feet and left ankle)    Diagnostic tests MRI of lumbar spine 11/10/19: multilevel degenerative changes, severe spinal canal stenosis at L3-L4 and L5-S1    Patient Stated Goals wants to get back to where he can do for himself.    Currently in Pain? Yes    Pain Score 6     Pain Location Foot    Pain Orientation Left    Pain Descriptors / Indicators Burning    Pain Type Chronic pain    Pain Onset More than a month ago                             [facility] Adult PT Treatment/Exercise - 05/28/20 1244      Transfers   Transfers Sit to  Stand;Stand to Sit    Sit to Stand 4: Min guard;4: Min assist    Sit to Stand Details Verbal cues for technique    Five time sit to stand comments  multiple reps throughout session, pt at times with BLE bracing against mat table    Stand to Sit 4: Min guard    Stand to Sit Details (indicate cue type and reason) Verbal cues for technique    Stand to Sit Details cues to reach with single UE posteriorly     Stand Pivot Transfers 3: Mod assist    Stand Pivot Transfer Details (indicate cue type and reason) with RW, x3 reps, cues for posture and assist with maneuvering RW while turning    Squat Pivot Transfers 5: Supervision    Squat Pivot Transfer Details (indicate cue type and reason) w/c <> mat      Ambulation/Gait   Ambulation/Gait Yes    Ambulation/Gait Assistance 3: Mod assist    Ambulation/Gait Assistance Details wheelchair follow for safety. seated rest break between each bout of gait. Cues for posture and for quad activation during  stance for improved foot clearance. Cues at times to decr step length with LLE. During 2nd bout of gait pt with incr forward flexed posture and decr ability to clear LLE during swing phase.     Ambulation Distance (Feet) 22 Feet   x1, 10 x 1   Assistive device Rolling walker   w/c follow for safety   Gait Pattern Step-to pattern;Step-through pattern;Decreased weight shift to right;Decreased dorsiflexion - left;Decreased dorsiflexion - right;Right foot flat;Left foot flat;Right flexed knee in stance;Left flexed knee in stance;Narrow base of support;Poor foot clearance - left    Pre-Gait Activities standing at RW: 2 minutes and 20 seconds with min guard with cues for posture and scapular retraction, pt improving with more upright posture throughout and knee extension. after gait performed an additional x1 minute and 1 minute and 25 seconds standing at edge of mat with RW, during standing bouts worked on M/L weight shifting with verbal and tactile cues for technique and  quad activation with weight shift      Exercises   Exercises Other Exercises    Other Exercises  seated at edge of mat AAROM heel slides on LLE x10 reps with use of pillow case to decr friction, pt able to perform first couple reps on own before needing assist from therapist                    PT Short Term Goals - 05/01/20 1113      PT SHORT TERM GOAL #1   Title Pt and pt's son/daughter will be independent in initial HEP for LE strengthening/ROM. ALL STGS DUE 02/14/20    Baseline Pt and son have been performing intial HEP    Time 5   due to delay in scheduling   Period Weeks    Status Achieved    Target Date 02/14/20   due to delay in scheduling     PT SHORT TERM GOAL #2   Title Pt will perform sit <> stand with min guard with RW in order to improve functional LE strength.    Baseline min guard for sit <> stand at RW from w/c and mat table.    Time 4    Period Weeks    Status Achieved    Target Date 05/08/20      PT SHORT TERM GOAL #3   Title Pt and pt's son/daughter will verbalize performing pressure relief at home in order to decr risk of pressure sores due to pt spending incr time in w/c and lying in bed.    Baseline Pt and son have been instructed in pressure relief.    Time 5    Period Weeks    Status Achieved      PT SHORT TERM GOAL #4   Title Pt will be able to stand for at least 3 minutes at RW with min guard in order to improve tolerance for ADLs.    Baseline 2 min 52 sec at RW CGA/min guard.    Time 4    Period Weeks    Status Partially Met    Target Date 05/08/20      PT SHORT TERM GOAL #5   Title Pt will ambulate 10' mod assist with RW for improved mobility.    Baseline 13' max/mod assist with RW ON 05/01/20    Time 4    Period Weeks    Status Partially Met    Target Date 05/08/20  PT Long Term Goals - 05/28/20 1541      PT LONG TERM GOAL #1   Title Pt and pt's son/daughter will be independent in final HEP for LE  strengthening/ROM.    Baseline PT continues to add to HEP. Have been working a lot of standing with son at home.    Time 9    Period Weeks    Status On-going      PT LONG TERM GOAL #2   Title Pt will be able to perform squat pivot vs. stand pivot transfers with RW from w/c to mat table with supervision in order to decr caregiver burden.    Baseline supervision with squat pivot but mod A with stand pivot transfers with RW from w/c to mat table on 05/28/20    Time 9    Period Weeks    Status Partially Met      PT LONG TERM GOAL #3   Title Pt will perform sit <> stand from w/c and mat table with RW with supervision in order to decr caregiver burden.    Baseline min guard/min assist (at times due to BLE bracing against surface)sit to stand with RW from w/c and mat on 05/28/20    Time 9    Period Weeks    Status On-going      PT LONG TERM GOAL #4   Title Pt will tolerate standing activities at [place] for at least 6 minutes in order to improve tolerance for ADLs.    Baseline 2 min 36 sec at [place] on 03/07/20, 2 minutes and 20 seconds at RW on 05/28/20    Time 9    Period Weeks    Status On-going      PT LONG TERM GOAL #5   Title Pt will ambulate 39' with RW min assist for improved mobility in home with son's assist.    Baseline 22' with RW with mod A on 05/28/20    Time 9    Period Weeks    Status On-going                 Plan - 05/28/20 1555    Clinical Impression Statement Pt able to demonstrate improvements today with standing at edge of mat with RW - pt with more upright posture with cues for scapular retraction and B knee extension. Pt able to perform stand pivot transfers today with RW with mod A, needing assist for steering during turns and for balance. Pt more fatigued during 2nd bout of gait today as noted by incr B knee flexion and trunk flexion. Will continue to progress towards LTGs.    Personal Factors and Comorbidities Age;Past/Current Experience    Examination-Activity  Limitations Bed Mobility;Stand;Transfers;Locomotion Level;Hygiene/Grooming;Bathing;Toileting    Examination-Participation Restrictions Community Activity;Cleaning;Meal Prep    Stability/Clinical Decision Making Evolving/Moderate complexity    Rehab Potential Fair    PT Frequency 2x / week    PT Duration --   9 weeks (2 visits left out of initial 15 [place] auth and requesting 15 more)   PT Treatment/Interventions Gait training;Therapeutic exercise;Neuromuscular re-education;Therapeutic activities;ADLs/Self Care Home Management;Patient/family education;Passive range of motion    PT Next Visit Plan start checking remainder of LTGs - will request more visits from [place]? stand pivot transfers with RW. sit <> stands from mat table and w/c with RW, standing balance at walker, pre-gait and gait with RW. Will need w/c follow. core/trunk/postural strengthening. SciFit in w/c    Consulted and Agree with Plan of Care Patient;Family  member/caregiver    Family Member Consulted pt's son Jeneen Rinks           Patient will benefit from skilled therapeutic intervention in order to improve the following deficits and impairments:  Decreased balance, Decreased activity tolerance, Decreased coordination, Decreased knowledge of use of DME, Decreased mobility, Decreased range of motion, Difficulty walking, Decreased strength, Impaired flexibility, Postural dysfunction, Impaired sensation, Pain  Visit Diagnosis: Other abnormalities of gait and mobility  Muscle weakness (generalized)  Unsteadiness on feet  Abnormal posture  Other symptoms and signs involving the nervous system     Problem List There are no problems to display for this patient.   Arliss Journey, PT, DPT  05/28/2020, 3:59 PM  [facility] [address] [place], [place], [address] Phone: [phone]   Fax:  [phone]  Name: Wayne Dudley MRN: [id number] Date of Birth:  July 28, 1923

## 2020-06-03 ENCOUNTER — Encounter: Payer: Non-veteran care | Admitting: Occupational Therapy

## 2020-06-03 ENCOUNTER — Ambulatory Visit: Payer: Non-veteran care | Admitting: Physical Therapy

## 2020-06-05 ENCOUNTER — Other Ambulatory Visit: Payer: Self-pay

## 2020-06-05 ENCOUNTER — Ambulatory Visit: Payer: No Typology Code available for payment source | Attending: Psychiatry

## 2020-06-05 ENCOUNTER — Ambulatory Visit: Payer: No Typology Code available for payment source | Admitting: Occupational Therapy

## 2020-06-05 DIAGNOSIS — R2689 Other abnormalities of gait and mobility: Secondary | ICD-10-CM | POA: Insufficient documentation

## 2020-06-05 DIAGNOSIS — R29818 Other symptoms and signs involving the nervous system: Secondary | ICD-10-CM | POA: Diagnosis present

## 2020-06-05 DIAGNOSIS — R209 Unspecified disturbances of skin sensation: Secondary | ICD-10-CM | POA: Diagnosis present

## 2020-06-05 DIAGNOSIS — R2681 Unsteadiness on feet: Secondary | ICD-10-CM

## 2020-06-05 DIAGNOSIS — M6281 Muscle weakness (generalized): Secondary | ICD-10-CM | POA: Diagnosis present

## 2020-06-05 DIAGNOSIS — G8929 Other chronic pain: Secondary | ICD-10-CM | POA: Insufficient documentation

## 2020-06-05 DIAGNOSIS — R293 Abnormal posture: Secondary | ICD-10-CM | POA: Insufficient documentation

## 2020-06-05 DIAGNOSIS — M25511 Pain in right shoulder: Secondary | ICD-10-CM | POA: Insufficient documentation

## 2020-06-05 NOTE — Therapy (Signed)
[facility] [address] [place], [place], [address] Phone: [phone]   Fax:  [phone]  Physical Therapy Treatment  Patient Details  Name: Wayne Dudley MRN: [id number] Date of Birth: 1923/04/24 Referring Provider (PT): [name], MD   Encounter Date: 06/05/2020   PT End of Session - 06/05/20 1110    Visit Number 29    Number of Visits 30   plus 15 visits from [place]   Date for PT Re-Evaluation 93/26/71   90 day cert but 60 day plan of care   Authorization Type VA - 15 authorized visits in 120 days (use by 07/23/20)    Authorization - Visit Number 14    Authorization - Number of Visits 15    PT Start Time 2458    PT Stop Time 1140    PT Time Calculation (min) 35 min    Equipment Utilized During Treatment Gait belt    Activity Tolerance Patient tolerated treatment well   seated rest breaks as needed throughout   Behavior During Therapy [facility] for tasks assessed/performed           No past medical history on file.  No past surgical history on file.  There were no vitals filed for this visit.   Subjective Assessment - 06/05/20 1111    Subjective Pt reports he is doing well. Feels arms and right leg are getting stronger.    Pertinent History footpain 2/2 to frostbite injury, CKD, hard of hearing.    How long can you stand comfortably? can stand for about 1-2 minutes before having pain (in the feet and left ankle)    Diagnostic tests MRI of lumbar spine 11/10/19: multilevel degenerative changes, severe spinal canal stenosis at L3-L4 and L5-S1    Patient Stated Goals wants to get back to where he can do for himself.    Currently in Pain? Yes    Pain Score 6     Pain Location Foot    Pain Orientation Left    Pain Descriptors / Indicators Burning    Pain Onset More than a month ago                             [facility] Adult PT Treatment/Exercise - 06/05/20 1111      Transfers   Transfers  Sit to Stand;Stand to [employer] Transfers    Sit to Stand 4: Min guard    Stand to Sit 4: Min guard    Stand Pivot Transfers 4: Min guard;4: Min Doctor, general practice Details (indicate cue type and reason) with RW w/c to mat    Comments Pt stood at walker 3 min 31 sec at RW.       Ambulation/Gait   Ambulation/Gait Yes    Ambulation/Gait Assistance 4: Min assist;3: Mod assist    Ambulation/Gait Assistance Details w/c follow. PT added shoe cover on right forefoot after first bout to help with sliding foot through which did help. Verbal cues to stand tall and try to push through legs to straighten as much as possible each step.    Ambulation Distance (Feet) 16 Feet   16' x 1, 21' x 1, 22' x 1   Assistive device Rolling walker   w/c follow   Gait Pattern Step-through pattern;Decreased step length - right;Decreased step length - left;Right flexed knee in stance;Left flexed knee in stance;Poor foot clearance - right  Ambulation Surface Level;Indoor                  PT Education - 06/05/20 1313    Education Details Discussed having only 1 authorized visit left from [place] and will request more visits as pt continues to progress with needing less assistance with mobility and wants to continue.    Person(s) Educated Patient;Child(ren)    Methods Explanation    Comprehension Verbalized understanding            PT Short Term Goals - 06/05/20 1120      PT SHORT TERM GOAL #1   Title Pt and pt's son/daughter will be independent in initial HEP for LE strengthening/ROM. ALL STGS DUE 02/14/20    Baseline Pt and son have been performing intial HEP    Time 5   due to delay in scheduling   Period Weeks    Status Achieved    Target Date 02/14/20   due to delay in scheduling     PT SHORT TERM GOAL #2   Title Pt will perform sit <> stand with min guard with RW in order to improve functional LE strength.    Baseline min guard for sit <> stand at RW from w/c and mat table.    Time  4    Period Weeks    Status Achieved    Target Date 05/08/20      PT SHORT TERM GOAL #3   Title Pt and pt's son/daughter will verbalize performing pressure relief at home in order to decr risk of pressure sores due to pt spending incr time in w/c and lying in bed.    Baseline Pt and son have been instructed in pressure relief.    Time 5    Period Weeks    Status Achieved      PT SHORT TERM GOAL #4   Title Pt will be able to stand for at least 3 minutes at RW with min guard in order to improve tolerance for ADLs.    Baseline 3 min 31 sec on 06/05/20 at [facility]    Time 4    Period Weeks    Status Achieved    Target Date 05/08/20      PT SHORT TERM GOAL #5   Title Pt will ambulate 10' mod assist with RW for improved mobility.    Baseline 13' max/mod assist with RW ON 05/01/20, 21' with RW min/mod assist on 06/05/20    Time 4    Period Weeks    Status Achieved    Target Date 05/08/20             PT Long Term Goals - 06/05/20 1312      PT LONG TERM GOAL #1   Title Pt and pt's son/daughter will be independent in final HEP for LE strengthening/ROM.    Baseline PT continues to add to HEP. Have been working a lot of standing with son at home.    Time 9    Period Weeks    Status On-going      PT LONG TERM GOAL #2   Title Pt will be able to perform squat pivot vs. stand pivot transfers with RW from w/c to mat table with supervision in order to decr caregiver burden.    Baseline supervision with squat pivot but min A with stand pivot transfers with RW from w/c to mat table on 06/05/20    Time 9    Period Weeks  Status Partially Met      PT LONG TERM GOAL #3   Title Pt will perform sit <> stand from w/c and mat table with RW with supervision in order to decr caregiver burden.    Baseline min guard/min assist (at times due to BLE bracing against surface)sit to stand with RW from w/c and mat on 05/28/20    Time 9    Period Weeks    Status On-going      PT LONG TERM GOAL #4   Title  Pt will tolerate standing activities at [place] for at least 6 minutes in order to improve tolerance for ADLs.    Baseline 2 min 36 sec at [place] on 03/07/20, 2 minutes and 20 seconds at [place] on 05/28/20, 3 min 31 sec on 06/05/20    Time 9    Period Weeks    Status On-going      PT LONG TERM GOAL #5   Title Pt will ambulate 14' with RW min assist for improved mobility in home with son's assist.    Baseline 22' with RW with mod A on 05/28/20    Time 9    Period Weeks    Status On-going                 Plan - 06/05/20 1314    Clinical Impression Statement PT started checking LTGs at visit today as pt only has 1 visit left in current [place] authorization. Pt continues to show improvement with transfers and gait requiring less assistance. He is consistently walking ~20' min/mod assist with RW with close w/c follow. PT added shoe cover to right front of shoe to help with advancement which did help. Pt was able to advance right foot forward without getting caught on shoe due to increased left knee flexion during stance. Recommending leather toe cap on right to help. Pt was able to increase standing time today and met STG with >3 min of standing time with more upright posture. Pt will continue to benefit from skilled PT to continue to work more on functional strengthening, balance, gait and transfers if more authorization can get approved. Requesting additional 15 visits.    Personal Factors and Comorbidities Age;Past/Current Experience    Examination-Activity Limitations Bed Mobility;Stand;Transfers;Locomotion Level;Hygiene/Grooming;Bathing;Toileting    Examination-Participation Restrictions Community Activity;Cleaning;Meal Prep    Stability/Clinical Decision Making Evolving/Moderate complexity    Rehab Potential Fair    PT Frequency 2x / week    PT Duration --   9 weeks (2 visits left out of initial 15 [place] auth and requesting 15 more)   PT Treatment/Interventions Gait training;Therapeutic exercise;Neuromuscular  re-education;Therapeutic activities;ADLs/Self Care Home Management;Patient/family education;Passive range of motion    PT Next Visit Plan Give them info for [employer] to schedule to get leather toe cap on right shoe.Finish check LTGs . I sent request for 15 additional visits to [place]. I would recert for the 15 visits over 90 day period to give time for auth to come back. stand pivot transfers with RW. sit <> stands from mat table and w/c with RW, standing balance at walker, pre-gait and gait with RW. Will need w/c follow. core/trunk/postural strengthening. SciFit in w/c    Consulted and Agree with Plan of Care Patient;Family member/caregiver    Family Member Consulted pt's son Jeneen Rinks           Patient will benefit from skilled therapeutic intervention in order to improve the following deficits and impairments:  Decreased balance, Decreased activity  tolerance, Decreased coordination, Decreased knowledge of use of DME, Decreased mobility, Decreased range of motion, Difficulty walking, Decreased strength, Impaired flexibility, Postural dysfunction, Impaired sensation, Pain  Visit Diagnosis: Other abnormalities of gait and mobility  Muscle weakness (generalized)  Unsteadiness on feet     Problem List There are no problems to display for this patient.   Electa Sniff, PT, DPT, NCS 06/05/2020, 1:22 PM  [facility] [address] [place], [place], [address] Phone: [phone]   Fax:  [phone]  Name: Wayne Dudley MRN: [id number] Date of Birth: 1923/10/07

## 2020-06-10 ENCOUNTER — Encounter: Payer: Self-pay | Admitting: Occupational Therapy

## 2020-06-10 ENCOUNTER — Ambulatory Visit: Payer: No Typology Code available for payment source | Admitting: Physical Therapy

## 2020-06-10 ENCOUNTER — Other Ambulatory Visit: Payer: Self-pay

## 2020-06-10 ENCOUNTER — Ambulatory Visit: Payer: No Typology Code available for payment source | Admitting: Occupational Therapy

## 2020-06-10 DIAGNOSIS — M6281 Muscle weakness (generalized): Secondary | ICD-10-CM

## 2020-06-10 DIAGNOSIS — R293 Abnormal posture: Secondary | ICD-10-CM

## 2020-06-10 DIAGNOSIS — G8929 Other chronic pain: Secondary | ICD-10-CM

## 2020-06-10 DIAGNOSIS — R2681 Unsteadiness on feet: Secondary | ICD-10-CM

## 2020-06-10 DIAGNOSIS — R29818 Other symptoms and signs involving the nervous system: Secondary | ICD-10-CM

## 2020-06-10 DIAGNOSIS — R2689 Other abnormalities of gait and mobility: Secondary | ICD-10-CM | POA: Diagnosis not present

## 2020-06-10 DIAGNOSIS — R208 Other disturbances of skin sensation: Secondary | ICD-10-CM

## 2020-06-10 NOTE — Patient Instructions (Signed)
Access Code: [email or web address] URL: [email or web address] Date: 06/10/2020 Prepared by: Sherlie Ban  Exercises Seated Hip Abduction with Resistance - 2 x daily - 5 x weekly - 2 sets - 10 reps Seated Hip Adduction Isometrics with Ball - 2 x daily - 5 x weekly - 2 sets - 10 reps Supine Gluteal Sets - 2 x daily - 5 x weekly - 2 sets - 10 reps Seated Heel Slide - 2 x daily - 5 x weekly - 2 sets - 10 reps Supine Hip and Knee Flexion PROM with Caregiver - 2 x daily - 5 x weekly - 3 sets - 20 hold Supine Ankle Dorsiflexion Stretch with Caregiver - 2 x daily - 5 x weekly - 3 sets - 20 hold Seated Scapular Retraction - 1-2 x daily - 5 x weekly - 2 sets - 10 reps Sitting Knee Extension with Resistance - 1 x daily - 5 x weekly - 2 sets - 5 reps Seated Hamstring Stretch - 3 x daily - 7 x weekly - 1 sets - 4 reps - 30 sec hold Supine Hip Flexor Stretch with Weight - 3 x daily - 7 x weekly - 1 sets - 4 reps - 30 sec hold

## 2020-06-10 NOTE — Therapy (Addendum)
[facility] [address] [place], [place], [address] Phone: [phone]   Fax:  [phone]  Physical Therapy Treatment/Re-Cert  Patient Details  Name: Wayne Dudley MRN: [id number] Date of Birth: 1922-12-20 Referring Provider (PT): [name], MD   Encounter Date: 06/10/2020   PT End of Session - 06/10/20 1802    Visit Number 30    Number of Visits 46   awaiting auth for 15 visits from [place]   Date for PT Re-Evaluation 73/22/02   90 day cert but 60 day plan of care   Authorization Type VA - 15 authorized visits in 120 days (use by 07/23/20) - awaiting for new auth for 15 visits from [place] - Visit Number 15    Authorization - Number of Visits 15    PT Start Time 1702    PT Stop Time 1743    PT Time Calculation (min) 41 min    Equipment Utilized During Treatment Gait belt    Activity Tolerance Patient tolerated treatment well   seated rest breaks as needed throughout   Behavior During Therapy [facility] for tasks assessed/performed           No past medical history on file.  No past surgical history on file.  There were no vitals filed for this visit.   Subjective Assessment - 06/10/20 1720    Subjective Feeling more tired today - was in [place] over the weekend.    Pertinent History footpain 2/2 to frostbite injury, CKD, hard of hearing.    How long can you stand comfortably? can stand for about 1-2 minutes before having pain (in the feet and left ankle)    Diagnostic tests MRI of lumbar spine 11/10/19: multilevel degenerative changes, severe spinal canal stenosis at L3-L4 and L5-S1    Patient Stated Goals wants to get back to where he can do for himself.    Currently in Pain? Yes    Pain Score 6     Pain Location Foot    Pain Orientation Left    Pain Descriptors / Indicators Burning    Pain Onset More than a month ago              [facility] PT Assessment - 06/11/20 0001      Assessment    Medical Diagnosis Spinal Stenosis    Referring Provider (PT) [name], MD      Prior Function   Level of Independence Independent with basic ADLs;Independent with homemaking with wheelchair                    06/11/20 0001  Assessment  Medical Diagnosis Spinal Stenosis  Referring Provider (PT) [name], MD  Prior Function  Level of Independence Independent with basic ADLs;Independent with homemaking with wheelchair          [facility] Adult PT Treatment/Exercise - 06/10/20 1719      Transfers   Transfers Sit to Stand;Stand to Sit;Stand Pivot Transfers    Sit to Stand 4: Min guard    Stand to Sit 4: Min guard      Ambulation/Gait   Ambulation/Gait Yes    Ambulation/Gait Assistance 4: Min assist;3: Mod assist    Ambulation/Gait Assistance Details w/c follow. pt more fatigued today with gait and demonstrating incr forward flexed posture and B knee flexion. verbal and tactile cues for posture. R shoe cover added to R foot for improved foot clearance    Ambulation  Distance (Feet) 5 Feet   x1, 16' x 1, 13' x 1   Assistive device Rolling walker    Gait Pattern Step-through pattern;Decreased step length - right;Decreased step length - left;Right flexed knee in stance;Left flexed knee in stance;Poor foot clearance - right    Ambulation Surface Level;Indoor              Access Code: [email or web address] URL: [email or web address] Date: 06/10/2020 Prepared by: Sherlie Ban  Reviewed pt's HEP: verbally reviewed non-bolded exercises and had pt demo understanding of bolded exercises.   Exercises Seated Hip Abduction with Resistance - 2 x daily - 5 x weekly - 2 sets - 10 reps Seated Hip Adduction Isometrics with Ball - 2 x daily - 5 x weekly - 2 sets - 10 reps Supine Gluteal Sets - 2 x daily - 5 x weekly - 2 sets - 10 reps Seated Heel Slide - 2 x daily - 5 x weekly - 2 sets - 10 reps Supine Hip and Knee Flexion PROM with Caregiver - 2 x daily - 5 x weekly -  3 sets - 20 hold Supine Ankle Dorsiflexion Stretch with Caregiver - 2 x daily - 5 x weekly - 3 sets - 20 hold Seated Scapular Retraction - 1-2 x daily - 5 x weekly - 2 sets - 10 reps Sitting Knee Extension with Resistance - 1 x daily - 5 x weekly - 2 sets - 5 reps Seated Hamstring Stretch - 3 x daily - 7 x weekly - 1 sets - 4 reps - 30 sec hold Supine Hip Flexor Stretch with Weight - 3 x daily - 7 x weekly - 1 sets - 4 reps - 30 sec hold     PT Education - 06/10/20 1740    Education Details reviewed HEP, performing re-cert for 2x week for 7 weeks if more visits from the [place] are granted, provided info on how to schedule appt to get a leather toe cap for R shoe at [employer]) Educated Patient;Child(ren)    Methods Explanation    Comprehension Verbalized understanding;Returned demonstration            PT Short Term Goals - 06/05/20 1120      PT SHORT TERM GOAL #1   Title Pt and pt's son/daughter will be independent in initial HEP for LE strengthening/ROM. ALL STGS DUE 02/14/20    Baseline Pt and son have been performing intial HEP    Time 5   due to delay in scheduling   Period Weeks    Status Achieved    Target Date 02/14/20   due to delay in scheduling     PT SHORT TERM GOAL #2   Title Pt will perform sit <> stand with min guard with RW in order to improve functional LE strength.    Baseline min guard for sit <> stand at RW from w/c and mat table.    Time 4    Period Weeks    Status Achieved    Target Date 05/08/20      PT SHORT TERM GOAL #3   Title Pt and pt's son/daughter will verbalize performing pressure relief at home in order to decr risk of pressure sores due to pt spending incr time in w/c and lying in bed.    Baseline Pt and son have been instructed in pressure relief.    Time 5    Period Weeks    Status Achieved      PT  SHORT TERM GOAL #4   Title Pt will be able to stand for at least 3 minutes at RW with min guard in order to improve tolerance for ADLs.     Baseline 3 min 31 sec on 06/05/20 at [facility]    Time 4    Period Weeks    Status Achieved    Target Date 05/08/20      PT SHORT TERM GOAL #5   Title Pt will ambulate 10' mod assist with RW for improved mobility.    Baseline 13' max/mod assist with RW ON 05/01/20, 21' with RW min/mod assist on 06/05/20    Time 4    Period Weeks    Status Achieved    Target Date 05/08/20          Revised STGs:  PT Short Term Goals - 06/15/20 2054      PT SHORT TERM GOAL #1   Title Pt will be able to perform squat pivot vs. stand pivot transfers with RW from w/c to mat table consistently with min A in order to decr caregiver burden. ALL STGS DUE 07/27/20    Time 6   due to delay in scheduling   Period Weeks    Status New    Target Date 07/27/20   due to delay in scheduling     PT SHORT TERM GOAL #2   Title Pt will perform sit <> stand with supervision with RW in order to improve functional LE strength.    Time 6    Period Weeks    Status New      PT SHORT TERM GOAL #3   Title Pt will be able to stand for at least 4 minutes at RW with min guard in order to improve tolerance for ADLs.    Baseline 3 min 31 sec on 06/05/20 at [facility]    Time 6    Period Weeks    Status New      PT SHORT TERM GOAL #4   Title Pt will ambulate 61' with RW min assist for improved mobility in home with son's assist.    Baseline 22' with RW with mod A on 05/28/20    Time 6    Period Weeks    Status New             PT Long Term Goals - 06/10/20 1801      PT LONG TERM GOAL #1   Title Pt and pt's son/daughter will be independent in final HEP for LE strengthening/ROM.    Baseline reviewed final HEP    Time 9    Period Weeks    Status Achieved      PT LONG TERM GOAL #2   Title Pt will be able to perform squat pivot vs. stand pivot transfers with RW from w/c to mat table with supervision in order to decr caregiver burden.    Baseline supervision with squat pivot but min A with stand pivot transfers with RW from w/c to  mat table on 06/05/20    Time 9    Period Weeks    Status Partially Met      PT LONG TERM GOAL #3   Title Pt will perform sit <> stand from w/c and mat table with RW with supervision in order to decr caregiver burden.    Baseline min guard/min assist (at times due to BLE bracing against surface)sit to stand with RW from w/c and mat on 05/28/20  Time 9    Period Weeks    Status Not Met      PT LONG TERM GOAL #4   Title Pt will tolerate standing activities at RW for at least 6 minutes in order to improve tolerance for ADLs.    Baseline 2 min 36 sec at [place] on 03/07/20, 2 minutes and 20 seconds at [place] on 05/28/20, 3 min 31 sec on 06/05/20    Time 9    Period Weeks    Status Not Met      PT LONG TERM GOAL #5   Title Pt will ambulate 74' with RW min assist for improved mobility in home with son's assist.    Baseline 22' with RW with mod A on 05/28/20    Time 9    Period Weeks    Status Not Met           Revised LTGs:  PT Long Term Goals - 06/15/20 2057      PT LONG TERM GOAL #1   Title Pt and pt's son/daughter will be independent in final HEP for LE strengthening/ROM. ALL LTGS DUE 08/24/20    Baseline ongoing    Time 10   due to delay in scheduling   Period Weeks    Status On-going    Target Date 08/24/20      PT LONG TERM GOAL #2   Title Pt will be able to perform squat pivot vs. stand pivot transfers with RW from w/c to mat table with min guard in order to decr caregiver burden.    Baseline supervision with squat pivot but min A with stand pivot transfers with RW from w/c to mat table on 06/05/20    Time 10    Period Weeks    Status New      PT LONG TERM GOAL #3   Title Pt will ambulate 1' with RW min assist for improved mobility in home with son's assist.    Baseline 22' with RW with mod A on 05/28/20    Time 10    Period Weeks    Status New      PT LONG TERM GOAL #4   Title Pt will tolerate standing activities at [place] for at least 6 minutes in order to improve tolerance for  ADLs.    Baseline 2 min 36 sec at [place] on 03/07/20, 2 minutes and 20 seconds at [place] on 05/28/20, 3 min 31 sec on 06/05/20    Time 10    Period Weeks    Status On-going                Plan - 06/10/20 1810    Clinical Impression Statement Pt feeling more fatigued during today's session due to spending the weekend in [place]. Unable to progress gait distance today with RW due to fatigue. However, on days when pt is not fatigued, pt consistently able to walk approx.. 20' with RW and min/mod assist and close w/c chair follow for safety. PT continued to use shoe cover to R shoe with improved foot clearance during gait. Remainder of session focused on reviewing pt's HEP with pt and son and reviewing proper technique for exercises. Pt able to improve standing time with RW in previous sessions and able to stand with more erect posture. Educated on continued importance of performing and standing at home while requesting and waiting to hear back from [place] for additional 15 visits. Will re-cert for an additional 1-2x week for 7 weeks  in order to continue to progress functional strengthening, balance, gait and transfers in order to improve functional mobility and independence. STGs and LTGs revised as appropriate.    Personal Factors and Comorbidities Age;Past/Current Experience    Examination-Activity Limitations Bed Mobility;Stand;Transfers;Locomotion Level;Hygiene/Grooming;Bathing;Toileting    Examination-Participation Restrictions Community Activity;Cleaning;Meal Prep    Stability/Clinical Decision Making Evolving/Moderate complexity    Rehab Potential Fair    PT Frequency 1-2x / week    PT Duration --   7 weeks   PT Treatment/Interventions Gait training;Therapeutic exercise;Neuromuscular re-education;Therapeutic activities;ADLs/Self Care Home Management;Patient/family education;Passive range of motion    PT Next Visit Plan did they get toe cap? gait with RW. Will need w/c follow. core/trunk/postural strengthening.  SciFit in w/c    Consulted and Agree with Plan of Care Patient;Family member/caregiver    Family Member Consulted pt's son Jeneen Rinks           Patient will benefit from skilled therapeutic intervention in order to improve the following deficits and impairments:  Decreased balance, Decreased activity tolerance, Decreased coordination, Decreased knowledge of use of DME, Decreased mobility, Decreased range of motion, Difficulty walking, Decreased strength, Impaired flexibility, Postural dysfunction, Impaired sensation, Pain  Visit Diagnosis: Other abnormalities of gait and mobility  Muscle weakness (generalized)  Unsteadiness on feet  Other symptoms and signs involving the nervous system  Abnormal posture     Problem List There are no problems to display for this patient.   Arliss Journey, PT, DPT  06/11/2020, 12:17 PM  [facility] [address] [place], [place], [address] Phone: [phone]   Fax:  [phone]  Name: Donyae Kilner MRN: [id number] Date of Birth: 03/31/1923

## 2020-06-10 NOTE — Patient Instructions (Signed)
  Coordination Activities  Perform the following activities for 10  Minutes 1-2  times per day with both hand(s).   Rotate ball in fingertips (clockwise and counter-clockwise).  Toss ball between hands.  Toss ball in air and catch with the same hand.  Flip cards 1 at a time as fast as you can.  Deal cards with your thumb (Hold deck in hand and push card off top with thumb).  Rotate card in hand (clockwise and counter-clockwise).  Shuffle cards.  Pick up coins and place in container or coin bank.  Pick up coins one at a time until you get 5-10 in your hand, then move coins from palm to fingertips to stack one at a time.

## 2020-06-10 NOTE — Therapy (Signed)
[facility] [address] [place], [place], [address] Phone: [phone]   Fax:  [phone]  Occupational Therapy Treatment  Patient Details  Name: Wayne Dudley MRN: [id number] Date of Birth: [birth year] Referring Provider (OT): Carrington Clamp   Encounter Date: 06/10/2020   OT End of Session - 06/10/20 1844    Visit Number 2    Number of Visits 15    Authorization Type VA - Auth 15 VISITS    Authorization - Visit Number 2    Authorization - Number of Visits 15    Progress Note Due on Visit 10    OT Start Time 1615    OT Stop Time 1700    OT Time Calculation (min) 45 min    Activity Tolerance Patient tolerated treatment well    Behavior During Therapy [facility] for tasks assessed/performed           History reviewed. No pertinent past medical history.  History reviewed. No pertinent surgical history.  There were no vitals filed for this visit.   Subjective Assessment - 06/10/20 1615    Subjective  The shoulder has been feeling better - I take tylenol, and it is helping    Patient is accompanied by: Family member    Currently in Pain? Yes    Pain Score 6     Pain Location Foot    Pain Orientation Left    Pain Descriptors / Indicators Burning    Pain Type Chronic pain    Pain Onset More than a month ago    Pain Frequency Constant    Aggravating Factors  standing    Pain Relieving Factors after taking tylenol                        OT Treatments/Exercises (OP) - 06/10/20 1841      ADLs   Toileting Patient able to flex forward in sitting to use two hands to tie shoes.  Patient was not aware that he could do that.  Patient encouraged to try reaching down to thread pants over legs.      Bathing Patient and son indicate that he only needs minimal assistance to get into shower, then to wash his head.  Patient encouraged to try to wash himself without help and report back.      ADL Comments  Reviewed OT short and long term goals with patient and his son.  Both are in agreement.        Neurological Re-education Exercises   Other Exercises 1 Set up HEP for BUE coordiantion - see patient instructions                  OT Education - 06/10/20 1844    Education Details OT goals, and coordination HEP    Person(s) Educated Patient;Child(ren)    Methods Explanation;Demonstration;Tactile cues;Verbal cues;Handout    Comprehension Verbalized understanding;Returned demonstration;Need further instruction            OT Short Term Goals - 06/10/20 1618      OT SHORT TERM GOAL #1   Title Patient will complete a home exercise program for BUE with set up assistance    Status On-going      OT SHORT TERM GOAL #2   Title Patient will dress his lower body with min assist    Baseline Mod assist    Time 4    Period Weeks    Status On-going  OT SHORT TERM GOAL #3   Title Patient will put toothpaste on toothbrush with supervision assist    Baseline min assist    Time 4    Period Weeks    Status On-going      OT SHORT TERM GOAL #4   Title Patient will transfer to toilet with minimal assist    Baseline mod assist    Period Weeks    Status On-going             OT Long Term Goals - 06/10/20 1620      OT LONG TERM GOAL #1   Title Patient will complete updated HEP independently    Status On-going      OT LONG TERM GOAL #2   Title Patient will dress lower body with set up assistance and supervision    Time 8    Period Weeks    Status On-going      OT LONG TERM GOAL #3   Title Patient will transfer to toilet with supervision    Time 8    Period Weeks    Status On-going      OT LONG TERM GOAL #4   Title Patient will tansfer to tub bench with min assistance, then shower with modified independence    Time 8    Status On-going      OT LONG TERM GOAL #5   Title Patient will shave himself with modified independence    Time 8    Period Weeks    Status  On-going                 Plan - 06/10/20 1537    Clinical Impression Statement Patient is thrilled with his progress thus far in physical therapy - reports standing better and walking short distances.  Anticipate this will aide in independence with ADL's.    OT Occupational Profile and History Detailed Assessment- Review of Records and additional review of physical, cognitive, psychosocial history related to current functional performance    Occupational performance deficits (Please refer to evaluation for details): ADL's;IADL's    Body Structure / Function / Physical Skills ADL;Coordination;Endurance;GMC;UE functional use;Sensation;Decreased knowledge of precautions;Balance;Body mechanics;Decreased knowledge of use of DME;Flexibility;IADL;Pain;Strength;FMC;Dexterity;Cardiopulmonary status limiting activity;Gait;Mobility;ROM    Rehab Potential Good    Clinical Decision Making Several treatment options, min-mod task modification necessary    Comorbidities Affecting Occupational Performance: May have comorbidities impacting occupational performance    Modification or Assistance to Complete Evaluation  Min-Moderate modification of tasks or assist with assess necessary to complete eval    OT Frequency 2x / week    OT Duration 8 weeks   15 visits   OT Treatment/Interventions Self-care/ADL training;Therapeutic exercise;Patient/family education;Neuromuscular education;Aquatic Therapy;Fluidtherapy;Building services engineer;Therapeutic activities;Balance training;Manual Therapy;DME and/or AE instruction    Plan Begin HEP for coordiantion, hand strength, shoulder range of motion bilateral    Consulted and Agree with Plan of Care Patient;Family member/caregiver    Family Member Consulted son Wayne Dudley           Patient will benefit from skilled therapeutic intervention in order to improve the following deficits and impairments:   Body Structure / Function / Physical Skills: ADL, Coordination,  Endurance, GMC, UE functional use, Sensation, Decreased knowledge of precautions, Balance, Body mechanics, Decreased knowledge of use of DME, Flexibility, IADL, Pain, Strength, FMC, Dexterity, Cardiopulmonary status limiting activity, Gait, Mobility, ROM       Visit Diagnosis: Muscle weakness (generalized)  Unsteadiness on feet  Abnormal posture  Other symptoms  and signs involving the nervous system  Other disturbances of skin sensation  Chronic right shoulder pain    Problem List There are no problems to display for this patient.   Collier Salina, OTR/L 06/10/2020, 6:48 PM  [facility] [address] [place], [place], [address] Phone: [phone]   Fax:  [phone]  Name: Wayne Dudley MRN: [id number] Date of Birth: 1923-06-15

## 2020-06-12 ENCOUNTER — Other Ambulatory Visit: Payer: Self-pay

## 2020-06-12 ENCOUNTER — Encounter: Payer: Self-pay | Admitting: Occupational Therapy

## 2020-06-12 ENCOUNTER — Ambulatory Visit: Payer: No Typology Code available for payment source | Admitting: Occupational Therapy

## 2020-06-12 DIAGNOSIS — R2681 Unsteadiness on feet: Secondary | ICD-10-CM

## 2020-06-12 DIAGNOSIS — M25511 Pain in right shoulder: Secondary | ICD-10-CM

## 2020-06-12 DIAGNOSIS — G8929 Other chronic pain: Secondary | ICD-10-CM

## 2020-06-12 DIAGNOSIS — R293 Abnormal posture: Secondary | ICD-10-CM

## 2020-06-12 DIAGNOSIS — M6281 Muscle weakness (generalized): Secondary | ICD-10-CM

## 2020-06-12 DIAGNOSIS — R2689 Other abnormalities of gait and mobility: Secondary | ICD-10-CM | POA: Diagnosis not present

## 2020-06-12 DIAGNOSIS — R29818 Other symptoms and signs involving the nervous system: Secondary | ICD-10-CM

## 2020-06-12 DIAGNOSIS — R208 Other disturbances of skin sensation: Secondary | ICD-10-CM

## 2020-06-12 NOTE — Therapy (Signed)
[facility] [address] [place], [place], [address] Phone: [phone]   Fax:  [phone]  Occupational Therapy Treatment  Patient Details  Name: Wayne Dudley MRN: [id number] Date of Birth: 1923/10/11 Referring Provider (OT): Carrington Clamp   Encounter Date: 06/12/2020   OT End of Session - 06/12/20 1541    Visit Number 3    Number of Visits 15    Authorization Type VA - Auth 15 VISITS    Authorization - Visit Number 3    Authorization - Number of Visits 15    Progress Note Due on Visit 10    OT Start Time 1445    OT Stop Time 1530    OT Time Calculation (min) 45 min    Activity Tolerance Patient tolerated treatment well    Behavior During Therapy [facility] for tasks assessed/performed           History reviewed. No pertinent past medical history.  History reviewed. No pertinent surgical history.  There were no vitals filed for this visit.   Subjective Assessment - 06/12/20 1503    Subjective  I try to eat the right things    Currently in Pain? Yes    Pain Score 5                         OT Treatments/Exercises (OP) - 06/12/20 0001      ADLs   Toileting Worked on components of toilet transfer - ambulating into bathroom - simulated his home set up.  Patient needs several seconds in standing before able to take effective steps - facilitation and cueing for improved hip/knee extension throughout task.        Exercises   Exercises Shoulder;Theraputty      Shoulder Exercises: Seated   Other Seated Exercises With weighted dowel (2lbs) completed chest press, bicep curl, and barrel rolls - x 10 reps - see patient instructions      Theraputty   Theraputty - Grip Red - see patient instructions                  OT Education - 06/12/20 1541    Education Details putty exercises for hand strength, and seated dowel exercises for shoulder and elbow    Person(s) Educated  Patient;Child(ren)    Methods Explanation;Demonstration;Tactile cues;Verbal cues;Handout    Comprehension Verbalized understanding;Returned demonstration;Need further instruction            OT Short Term Goals - 06/12/20 1545      OT SHORT TERM GOAL #1   Title Patient will complete a home exercise program for BUE with set up assistance    Status On-going      OT SHORT TERM GOAL #2   Title Patient will dress his lower body with min assist    Baseline Mod assist    Time 4    Period Weeks    Status On-going      OT SHORT TERM GOAL #3   Title Patient will put toothpaste on toothbrush with supervision assist    Baseline min assist    Time 4    Period Weeks    Status Achieved      OT SHORT TERM GOAL #4   Title Patient will transfer to toilet with minimal assist    Baseline mod assist    Period Weeks    Status On-going  OT Long Term Goals - 06/12/20 1545      OT LONG TERM GOAL #1   Title Patient will complete updated HEP independently    Status On-going      OT LONG TERM GOAL #2   Title Patient will dress lower body with set up assistance and supervision    Time 8    Period Weeks    Status On-going      OT LONG TERM GOAL #3   Title Patient will transfer to toilet with supervision    Time 8    Period Weeks    Status On-going      OT LONG TERM GOAL #4   Title Patient will tansfer to tub bench with min assistance, then shower with modified independence    Time 8    Status On-going      OT LONG TERM GOAL #5   Title Patient will shave himself with modified independence    Time 8    Period Weeks    Status On-going                 Plan - 06/12/20 1542    Clinical Impression Statement Patient is highly motivated for improved mobility and increased independence with ADL    OT Occupational Profile and History Detailed Assessment- Review of Records and additional review of physical, cognitive, psychosocial history related to current functional  performance    Occupational performance deficits (Please refer to evaluation for details): ADL's;IADL's    Body Structure / Function / Physical Skills ADL;Coordination;Endurance;GMC;UE functional use;Sensation;Decreased knowledge of precautions;Balance;Body mechanics;Decreased knowledge of use of DME;Flexibility;IADL;Pain;Strength;FMC;Dexterity;Cardiopulmonary status limiting activity;Gait;Mobility;ROM    Rehab Potential Good    Clinical Decision Making Several treatment options, min-mod task modification necessary    Comorbidities Affecting Occupational Performance: May have comorbidities impacting occupational performance    Modification or Assistance to Complete Evaluation  Min-Moderate modification of tasks or assist with assess necessary to complete eval    OT Frequency 2x / week    OT Duration 8 weeks    OT Treatment/Interventions Self-care/ADL training;Therapeutic exercise;Patient/family education;Neuromuscular education;Aquatic Therapy;Fluidtherapy;Building services engineer;Therapeutic activities;Balance training;Manual Therapy;DME and/or AE instruction    Plan Check HEP coordination, putty , shoulder.  Functional mobility - has difficulty with turns - consistent min to mod assist for any walking. UBE?    OT Home Exercise Plan coordination, therapy putty, dowel exercises for BUE    Consulted and Agree with Plan of Care Patient;Family member/caregiver    Family Member Consulted son [name]           Patient will benefit from skilled therapeutic intervention in order to improve the following deficits and impairments:   Body Structure / Function / Physical Skills: ADL, Coordination, Endurance, GMC, UE functional use, Sensation, Decreased knowledge of precautions, Balance, Body mechanics, Decreased knowledge of use of DME, Flexibility, IADL, Pain, Strength, FMC, Dexterity, Cardiopulmonary status limiting activity, Gait, Mobility, ROM       Visit Diagnosis: Muscle weakness  (generalized)  Unsteadiness on feet  Other symptoms and signs involving the nervous system  Abnormal posture  Other disturbances of skin sensation  Chronic right shoulder pain    Problem List There are no problems to display for this patient.   Collier Salina, OTR/L 06/12/2020, 3:47 PM  [facility] [address] [place], [place], [address] Phone: [phone]   Fax:  [phone]  Name: Nicoli Nardozzi MRN: [id number] Date of Birth: [birth year]

## 2020-06-12 NOTE — Patient Instructions (Signed)
11. Grip Strengthening (Resistive Putty)   Squeeze putty using thumb and all fingers. Repeat 15 times. Do 1-2 sessions per day.   Extension (Assistive Putty)   Roll putty back and forth, being sure to use all fingertips. Repeat 3 times. Do 1-2 sessions per day.  Then pinch as below.   Palmar Pinch Strengthening (Resistive Putty)   Pinch putty between thumb and each fingertip in turn after rolling out         .    Lateral Pinch Strengthening (Resistive Putty)    Squeeze between thumb and side of each finger in turn. Repeat 10 times. Do 1-2 sessions per day.     Upper Body Strengthening Using a cane or bar - seated in chair Bicep curl - turn knucklesdown/palms up and bend elbows - bring bar toward chest and down toward thighs - 10 times rest repeat  Chest Press- turn knuckles down/palms down and start with bar at your chest, and straighten elbows to push bar away from you.   10 times rest repeat  Barrel Roll - same hand position as chest press - Move arms in a circle forward 5 times and backward 5 times.  Circle moves to height of shoulders and down toward lap.  10 times rest repeat

## 2020-06-15 NOTE — Addendum Note (Signed)
Addended by: Drake Leach on: 06/15/2020 09:00 PM   Modules accepted: Orders

## 2020-06-16 ENCOUNTER — Ambulatory Visit: Payer: No Typology Code available for payment source | Admitting: Occupational Therapy

## 2020-06-16 ENCOUNTER — Other Ambulatory Visit: Payer: Self-pay

## 2020-06-16 ENCOUNTER — Encounter: Payer: Self-pay | Admitting: Occupational Therapy

## 2020-06-16 DIAGNOSIS — R2689 Other abnormalities of gait and mobility: Secondary | ICD-10-CM | POA: Diagnosis not present

## 2020-06-16 DIAGNOSIS — R29818 Other symptoms and signs involving the nervous system: Secondary | ICD-10-CM

## 2020-06-16 DIAGNOSIS — R2681 Unsteadiness on feet: Secondary | ICD-10-CM

## 2020-06-16 DIAGNOSIS — R293 Abnormal posture: Secondary | ICD-10-CM

## 2020-06-16 DIAGNOSIS — M6281 Muscle weakness (generalized): Secondary | ICD-10-CM

## 2020-06-16 NOTE — Therapy (Signed)
[facility] [address] [place], [place], [address] Phone: [phone]   Fax:  [phone]  Occupational Therapy Treatment  Patient Details  Name: Wayne Dudley MRN: [id number] Date of Birth: 1923/04/01 Referring Provider (OT): Carrington Clamp   Encounter Date: 06/16/2020   OT End of Session - 06/16/20 1018    Visit Number 4    Number of Visits 15    Authorization Type VA - Auth 15 VISITS    Authorization - Visit Number 4    Authorization - Number of Visits 15    Progress Note Due on Visit 10    OT Start Time 1019    OT Stop Time 1100    OT Time Calculation (min) 41 min    Activity Tolerance Patient tolerated treatment well    Behavior During Therapy [facility] for tasks assessed/performed           History reviewed. No pertinent past medical history.  History reviewed. No pertinent surgical history.  There were no vitals filed for this visit.   Subjective Assessment - 06/16/20 1018    Subjective  The shoulder has been feeling better - I take tylenol, and it is helping.  Pt is tying shoes now.    Patient is accompanied by: Family member   son--James   Pertinent History HTN, HBP, CKD2, CATARACT REMOVAL 2018, STENOSIS    Currently in Pain? Yes   chronic pain L foot (unknown alleviating/aggravating factors)   Pain Score 5             Functional reaching to place/remove clothespins with 1-8lb resistance on vertical pole with each UE alternating with rest break prior to removing for incr activity tolerance/strength.  Simulated donning pants with theraband loop.  Pt able to place around feet with min A and pull up to thighs and 1 UE support on table, then pt able to pull up from ankles to thighs.  (Son reports that pt is able to pull up pants over hip once on thighs at home.  Pt able to doff theraband loop with close supervision.  Recommended that once son places pants on feet that pt pull pants from ankles over  hips and then doff pants with close supervision at home.  Pt/son to try and then discuss next visit.  Arm bike x5 min level 1 for conditioning (forward/backwards) without rest.  Reviewed Cane HEP (with 2lb wt) and pt returned demo each (2 sets of 10) with occasional min v.c.      OT Short Term Goals - 06/12/20 1545      OT SHORT TERM GOAL #1   Title Patient will complete a home exercise program for BUE with set up assistance    Status On-going      OT SHORT TERM GOAL #2   Title Patient will dress his lower body with min assist    Baseline Mod assist    Time 4    Period Weeks    Status On-going      OT SHORT TERM GOAL #3   Title Patient will put toothpaste on toothbrush with supervision assist    Baseline min assist    Time 4    Period Weeks    Status Achieved      OT SHORT TERM GOAL #4   Title Patient will transfer to toilet with minimal assist    Baseline mod assist    Period Weeks    Status On-going  OT Long Term Goals - 06/12/20 1545      OT LONG TERM GOAL #1   Title Patient will complete updated HEP independently    Status On-going      OT LONG TERM GOAL #2   Title Patient will dress lower body with set up assistance and supervision    Time 8    Period Weeks    Status On-going      OT LONG TERM GOAL #3   Title Patient will transfer to toilet with supervision    Time 8    Period Weeks    Status On-going      OT LONG TERM GOAL #4   Title Patient will tansfer to tub bench with min assistance, then shower with modified independence    Time 8    Status On-going      OT LONG TERM GOAL #5   Title Patient will shave himself with modified independence    Time 8    Period Weeks    Status On-going                 Plan - 06/16/20 1018    Clinical Impression Statement Pt is progressing towards goals for incr independence with ADLs.  Pt/son reports that pt is now tying shoes.    OT Occupational Profile and History Detailed Assessment-  Review of Records and additional review of physical, cognitive, psychosocial history related to current functional performance    Occupational performance deficits (Please refer to evaluation for details): ADL's;IADL's    Body Structure / Function / Physical Skills ADL;Coordination;Endurance;GMC;UE functional use;Sensation;Decreased knowledge of precautions;Balance;Body mechanics;Decreased knowledge of use of DME;Flexibility;IADL;Pain;Strength;FMC;Dexterity;Cardiopulmonary status limiting activity;Gait;Mobility;ROM    Rehab Potential Good    Clinical Decision Making Several treatment options, min-mod task modification necessary    Comorbidities Affecting Occupational Performance: May have comorbidities impacting occupational performance    Modification or Assistance to Complete Evaluation  Min-Moderate modification of tasks or assist with assess necessary to complete eval    OT Frequency 2x / week    OT Duration 8 weeks    OT Treatment/Interventions Self-care/ADL training;Therapeutic exercise;Patient/family education;Neuromuscular education;Aquatic Therapy;Fluidtherapy;Building services engineer;Therapeutic activities;Balance training;Manual Therapy;DME and/or AE instruction    Plan Check HEP coordination, putty prn.   Functional mobility - has difficulty with turns - consistent min to mod assist for any walking. UBE, continue to address ADL/strategies    OT Home Exercise Plan coordination, therapy putty, dowel exercises for BUE    Consulted and Agree with Plan of Care Patient;Family member/caregiver    Family Member Consulted son [name]           Patient will benefit from skilled therapeutic intervention in order to improve the following deficits and impairments:   Body Structure / Function / Physical Skills: ADL, Coordination, Endurance, GMC, UE functional use, Sensation, Decreased knowledge of precautions, Balance, Body mechanics, Decreased knowledge of use of DME, Flexibility, IADL, Pain,  Strength, FMC, Dexterity, Cardiopulmonary status limiting activity, Gait, Mobility, ROM       Visit Diagnosis: Muscle weakness (generalized)  Unsteadiness on feet  Other symptoms and signs involving the nervous system  Abnormal posture  Other abnormalities of gait and mobility    Problem List There are no problems to display for this patient.   [facility] 06/16/2020, 11:05 AM  [facility] [facility] [address] [place], [place], [address] Phone: [phone]   Fax:  [phone]  Name: Wayne Dudley MRN: [id number] Date of Birth: November 13, 1922  Willa Frater, OTR/L [facility] [address] [place], [place]  [address] [phone] phone [phone] 06/16/20 11:05 AM

## 2020-06-18 ENCOUNTER — Other Ambulatory Visit: Payer: Self-pay

## 2020-06-18 ENCOUNTER — Ambulatory Visit: Payer: No Typology Code available for payment source | Admitting: Occupational Therapy

## 2020-06-18 DIAGNOSIS — R2689 Other abnormalities of gait and mobility: Secondary | ICD-10-CM | POA: Diagnosis not present

## 2020-06-18 DIAGNOSIS — R29818 Other symptoms and signs involving the nervous system: Secondary | ICD-10-CM

## 2020-06-18 DIAGNOSIS — M6281 Muscle weakness (generalized): Secondary | ICD-10-CM

## 2020-06-18 NOTE — Therapy (Signed)
[facility] [address] [place], [place], [address] Phone: [phone]   Fax:  [phone]  Occupational Therapy Treatment  Patient Details  Name: Wayne Dudley MRN: [id number] Date of Birth: 16-Sep-1923 Referring Provider (OT): Carrington Clamp   Encounter Date: 06/18/2020   OT End of Session - 06/18/20 1417    Visit Number 5    Number of Visits 15    Authorization Type VA - Auth 15 VISITS    Authorization - Visit Number 5    Authorization - Number of Visits 15    Progress Note Due on Visit 20    OT Start Time 1100    OT Stop Time 1145    OT Time Calculation (min) 45 min    Activity Tolerance Patient tolerated treatment well    Behavior During Therapy [facility] for tasks assessed/performed           No past medical history on file.  No past surgical history on file.  There were no vitals filed for this visit.   Subjective Assessment - 06/18/20 1109    Patient is accompanied by: Family member   son   Pertinent History HTN, HBP, CKD2, CATARACT REMOVAL 2018, STENOSIS    Currently in Pain? No/denies           Extensively reviewed coordination activities for bilateral hands - pt required cues to perform correctly. Reviewed/clarified putty HEP - adapted tip pinch for just index and long finger. Discussed plan for next session to work on more standing and functional mobility                       OT Short Term Goals - 06/12/20 1545      OT SHORT TERM GOAL #1   Title Patient will complete a home exercise program for BUE with set up assistance    Status On-going      OT SHORT TERM GOAL #2   Title Patient will dress his lower body with min assist    Baseline Mod assist    Time 4    Period Weeks    Status On-going      OT SHORT TERM GOAL #3   Title Patient will put toothpaste on toothbrush with supervision assist    Baseline min assist    Time 4    Period Weeks    Status Achieved       OT SHORT TERM GOAL #4   Title Patient will transfer to toilet with minimal assist    Baseline mod assist    Period Weeks    Status On-going             OT Long Term Goals - 06/12/20 1545      OT LONG TERM GOAL #1   Title Patient will complete updated HEP independently    Status On-going      OT LONG TERM GOAL #2   Title Patient will dress lower body with set up assistance and supervision    Time 8    Period Weeks    Status On-going      OT LONG TERM GOAL #3   Title Patient will transfer to toilet with supervision    Time 8    Period Weeks    Status On-going      OT LONG TERM GOAL #4   Title Patient will tansfer to tub bench with min assistance, then shower with modified independence    Time  8    Status On-going      OT LONG TERM GOAL #5   Title Patient will shave himself with modified independence    Time 8    Period Weeks    Status On-going                 Plan - 06/18/20 1419    Clinical Impression Statement Pt required review and continued cues for HEP's d/t cognitive deficits.    OT Occupational Profile and History Detailed Assessment- Review of Records and additional review of physical, cognitive, psychosocial history related to current functional performance    Occupational performance deficits (Please refer to evaluation for details): ADL's;IADL's    Body Structure / Function / Physical Skills ADL;Coordination;Endurance;GMC;UE functional use;Sensation;Decreased knowledge of precautions;Balance;Body mechanics;Decreased knowledge of use of DME;Flexibility;IADL;Pain;Strength;FMC;Dexterity;Cardiopulmonary status limiting activity;Gait;Mobility;ROM    Rehab Potential Good    Clinical Decision Making Several treatment options, min-mod task modification necessary    Comorbidities Affecting Occupational Performance: May have comorbidities impacting occupational performance    Modification or Assistance to Complete Evaluation  Min-Moderate modification of  tasks or assist with assess necessary to complete eval    OT Frequency 2x / week    OT Duration 8 weeks    OT Treatment/Interventions Self-care/ADL training;Therapeutic exercise;Patient/family education;Neuromuscular education;Aquatic Therapy;Fluidtherapy;Building services engineer;Therapeutic activities;Balance training;Manual Therapy;DME and/or AE instruction    Plan Functional mobility - has difficulty with turns - consistent min to mod assist for any walking. UBE, continue to address ADL/strategies    OT Home Exercise Plan coordination, therapy putty, dowel exercises for BUE    Consulted and Agree with Plan of Care Patient;Family member/caregiver    Family Member Consulted son [name]           Patient will benefit from skilled therapeutic intervention in order to improve the following deficits and impairments:   Body Structure / Function / Physical Skills: ADL, Coordination, Endurance, GMC, UE functional use, Sensation, Decreased knowledge of precautions, Balance, Body mechanics, Decreased knowledge of use of DME, Flexibility, IADL, Pain, Strength, FMC, Dexterity, Cardiopulmonary status limiting activity, Gait, Mobility, ROM       Visit Diagnosis: Muscle weakness (generalized)  Other symptoms and signs involving the nervous system    Problem List There are no problems to display for this patient.   Kelli Churn, OTR/L 06/18/2020, 2:21 PM  [facility] [address] [place], [place], [address] Phone: [phone]   Fax:  [phone]  Name: Wayne Dudley MRN: [id number] Date of Birth: Apr 26, 1923

## 2020-06-24 ENCOUNTER — Encounter: Payer: Self-pay | Admitting: Occupational Therapy

## 2020-06-24 ENCOUNTER — Ambulatory Visit: Payer: No Typology Code available for payment source | Admitting: Occupational Therapy

## 2020-06-24 ENCOUNTER — Other Ambulatory Visit: Payer: Self-pay

## 2020-06-24 DIAGNOSIS — R2681 Unsteadiness on feet: Secondary | ICD-10-CM

## 2020-06-24 DIAGNOSIS — R208 Other disturbances of skin sensation: Secondary | ICD-10-CM

## 2020-06-24 DIAGNOSIS — M6281 Muscle weakness (generalized): Secondary | ICD-10-CM

## 2020-06-24 DIAGNOSIS — R293 Abnormal posture: Secondary | ICD-10-CM

## 2020-06-24 DIAGNOSIS — M25511 Pain in right shoulder: Secondary | ICD-10-CM

## 2020-06-24 DIAGNOSIS — R2689 Other abnormalities of gait and mobility: Secondary | ICD-10-CM | POA: Diagnosis not present

## 2020-06-24 DIAGNOSIS — R29818 Other symptoms and signs involving the nervous system: Secondary | ICD-10-CM

## 2020-06-24 NOTE — Therapy (Signed)
[facility] [address] [place], [place], [address] Phone: [phone]   Fax:  [phone]  Occupational Therapy Treatment  Patient Details  Name: Wayne Dudley MRN: [id number] Date of Birth: August 13, 1923 Referring Provider (OT): Carrington Clamp   Encounter Date: 06/24/2020   OT End of Session - 06/24/20 1400    Visit Number 6    Number of Visits 15    Authorization Type VA - Auth 15 VISITS    Authorization - Visit Number 6    Authorization - Number of Visits 15    Progress Note Due on Visit 10    OT Start Time 1315    OT Stop Time 1400    OT Time Calculation (min) 45 min    Activity Tolerance Patient tolerated treatment well    Behavior During Therapy [facility] for tasks assessed/performed           History reviewed. No pertinent past medical history.  History reviewed. No pertinent surgical history.  There were no vitals filed for this visit.   Subjective Assessment - 06/24/20 1325    Subjective  I have been tying my own shoes - that feels good.    Patient is accompanied by: Family member    Pertinent History HTN, HBP, CKD2, CATARACT REMOVAL 2018, STENOSIS    Currently in Pain? Yes    Pain Score 6     Pain Location Foot    Pain Orientation Left    Pain Descriptors / Indicators Burning    Pain Type Chronic pain    Pain Frequency Constant    Aggravating Factors  standing    Pain Relieving Factors tylenol              [facility] OT Assessment - 06/24/20 0001      Hand Function   Right Hand Grip (lbs) 16    Left Hand Grip (lbs) 62.3    Left Hand Lateral Pinch 20 lbs                    OT Treatments/Exercises (OP) - 06/24/20 0001      ADLs   Grooming Patient's son reports that patient is taking more initiative to put toothpast on toothbrush.,      LB Dressing Patient consistently tying shoes.  Still reports difficulty getting pants over feet.  Pulling pants from thighs over hips     Functional Mobility Continuing to work on active standing with reduced UE support.  Standing, stepping, turning, backing up.        Shoulder Exercises: Seated   Other Seated Exercises UBE X 5 MIN LEVEL 2                  OT Education - 06/24/20 1359    Education Details Reduce UE reliance in standing to help with ADL    Person(s) Educated Patient;Child(ren)    Methods Explanation;Demonstration    Comprehension Need further instruction            OT Short Term Goals - 06/24/20 1403      OT SHORT TERM GOAL #1   Title Patient will complete a home exercise program for BUE with set up assistance    Status On-going      OT SHORT TERM GOAL #2   Title Patient will dress his lower body with min assist    Baseline Mod assist    Time 4    Period Weeks    Status On-going  OT SHORT TERM GOAL #3   Title Patient will put toothpaste on toothbrush with supervision assist    Baseline min assist    Time 4    Period Weeks    Status Achieved      OT SHORT TERM GOAL #4   Title Patient will transfer to toilet with minimal assist    Baseline mod assist    Period Weeks    Status On-going             OT Long Term Goals - 06/24/20 1403      OT LONG TERM GOAL #1   Title Patient will complete updated HEP independently    Status On-going      OT LONG TERM GOAL #2   Title Patient will dress lower body with set up assistance and supervision    Time 8    Period Weeks    Status On-going      OT LONG TERM GOAL #3   Title Patient will transfer to toilet with supervision    Time 8    Period Weeks    Status On-going      OT LONG TERM GOAL #4   Title Patient will tansfer to tub bench with min assistance, then shower with modified independence    Time 8    Status On-going      OT LONG TERM GOAL #5   Title Patient will shave himself with modified independence    Time 8    Period Weeks    Status On-going                 Plan - 06/24/20 1401    Clinical  Impression Statement Patient consistently using putty and showing improved grip and pinch strength bilaterally    OT Occupational Profile and History Detailed Assessment- Review of Records and additional review of physical, cognitive, psychosocial history related to current functional performance    Occupational performance deficits (Please refer to evaluation for details): ADL's;IADL's    Body Structure / Function / Physical Skills ADL;Coordination;Endurance;GMC;UE functional use;Sensation;Decreased knowledge of precautions;Balance;Body mechanics;Decreased knowledge of use of DME;Flexibility;IADL;Pain;Strength;FMC;Dexterity;Cardiopulmonary status limiting activity;Gait;Mobility;ROM    Rehab Potential Good    Clinical Decision Making Several treatment options, min-mod task modification necessary    Comorbidities Affecting Occupational Performance: May have comorbidities impacting occupational performance    Modification or Assistance to Complete Evaluation  Min-Moderate modification of tasks or assist with assess necessary to complete eval    OT Frequency 2x / week    OT Duration 8 weeks    OT Treatment/Interventions Self-care/ADL training;Therapeutic exercise;Patient/family education;Neuromuscular education;Aquatic Therapy;Fluidtherapy;Building services engineer;Therapeutic activities;Balance training;Manual Therapy;DME and/or AE instruction    Plan Functional mobility - has difficulty with turns - consistent min to mod assist for any walking. UBE, continue to address ADL/strategies    OT Home Exercise Plan coordination, therapy putty, dowel exercises for BUE    Consulted and Agree with Plan of Care Patient;Family member/caregiver    Family Member Consulted son [name]           Patient will benefit from skilled therapeutic intervention in order to improve the following deficits and impairments:   Body Structure / Function / Physical Skills: ADL, Coordination, Endurance, GMC, UE functional use,  Sensation, Decreased knowledge of precautions, Balance, Body mechanics, Decreased knowledge of use of DME, Flexibility, IADL, Pain, Strength, FMC, Dexterity, Cardiopulmonary status limiting activity, Gait, Mobility, ROM       Visit Diagnosis: Muscle weakness (generalized)  Other symptoms and signs  involving the nervous system  Unsteadiness on feet  Abnormal posture  Other disturbances of skin sensation  Chronic right shoulder pain    Problem List There are no problems to display for this patient.   Collier Salina , OTR/L 06/24/2020, 2:04 PM  [facility] [address] [place], [place], [address] Phone: [phone]   Fax:  [phone]  Name: Wayne Dudley MRN: [id number] Date of Birth: [birth year]

## 2020-06-26 ENCOUNTER — Ambulatory Visit: Payer: No Typology Code available for payment source | Admitting: Occupational Therapy

## 2020-06-26 ENCOUNTER — Encounter: Payer: Self-pay | Admitting: Occupational Therapy

## 2020-06-26 ENCOUNTER — Other Ambulatory Visit: Payer: Self-pay

## 2020-06-26 DIAGNOSIS — R208 Other disturbances of skin sensation: Secondary | ICD-10-CM

## 2020-06-26 DIAGNOSIS — R2681 Unsteadiness on feet: Secondary | ICD-10-CM

## 2020-06-26 DIAGNOSIS — R29818 Other symptoms and signs involving the nervous system: Secondary | ICD-10-CM

## 2020-06-26 DIAGNOSIS — R2689 Other abnormalities of gait and mobility: Secondary | ICD-10-CM | POA: Diagnosis not present

## 2020-06-26 DIAGNOSIS — M25511 Pain in right shoulder: Secondary | ICD-10-CM

## 2020-06-26 DIAGNOSIS — R293 Abnormal posture: Secondary | ICD-10-CM

## 2020-06-26 DIAGNOSIS — M6281 Muscle weakness (generalized): Secondary | ICD-10-CM

## 2020-06-26 NOTE — Therapy (Signed)
[facility] [address] [place], [place], [address] Phone: [phone]   Fax:  [phone]  Occupational Therapy Treatment  Patient Details  Name: Wayne Dudley MRN: [id number] Date of Birth: December 23, 1922 Referring Provider (OT): Carrington Clamp   Encounter Date: 06/26/2020   OT End of Session - 06/26/20 1254    Visit Number 7    Number of Visits 15    Authorization Type VA - Auth 15 VISITS    Authorization - Visit Number 7    Authorization - Number of Visits 15    Progress Note Due on Visit 10    OT Start Time 1145    OT Stop Time 1230    OT Time Calculation (min) 45 min    Activity Tolerance Patient tolerated treatment well    Behavior During Therapy [facility] for tasks assessed/performed           History reviewed. No pertinent past medical history.  History reviewed. No pertinent surgical history.  There were no vitals filed for this visit.   Subjective Assessment - 06/26/20 1145    Subjective  I am feeling good - more cheerful    Patient is accompanied by: Family member    Pertinent History HTN, HBP, CKD2, CATARACT REMOVAL 2018, STENOSIS    Currently in Pain? Yes    Pain Score 6     Pain Location Foot    Pain Orientation Left    Pain Descriptors / Indicators Burning    Pain Type Chronic pain                        OT Treatments/Exercises (OP) - 06/26/20 0001      ADLs   LB Dressing Worked on lower body dressing today - doffing/donninbg shoes, donning pants - with use of shoehorn, reacher, and foot stool.  Patient has difficulty flexing straight forward to reach to feet but when seated at edge of bed - able to abduct and ER hip - easier to reavch foot on R.  Patient's bed though is high so talked with son about trying to dress lower body with arm restst removed from wheelchair to allow greater hip mobility.      Functional Mobility Continuing to work on standing stepping and turning as  needed for bathroom transfers.        Neurological Re-education Exercises   Hand Gripper with Large Beads Set at level 2 to address bilateral hand strength                  OT Education - 06/26/20 1253    Education Details adapted positions and equipment to address LB dressing    Person(s) Educated Patient;Child(ren)    Methods Explanation;Demonstration;Verbal cues    Comprehension Verbalized understanding;Returned demonstration            OT Short Term Goals - 06/26/20 1256      OT SHORT TERM GOAL #1   Title Patient will complete a home exercise program for BUE with set up assistance    Status On-going      OT SHORT TERM GOAL #2   Title Patient will dress his lower body with min assist    Baseline Mod assist    Time 4    Period Weeks    Status On-going      OT SHORT TERM GOAL #3   Title Patient will put toothpaste on toothbrush with supervision assist    Baseline  min assist    Time 4    Period Weeks    Status Achieved      OT SHORT TERM GOAL #4   Title Patient will transfer to toilet with minimal assist    Baseline mod assist    Period Weeks    Status On-going             OT Long Term Goals - 06/26/20 1256      OT LONG TERM GOAL #1   Title Patient will complete updated HEP independently    Status On-going      OT LONG TERM GOAL #2   Title Patient will dress lower body with set up assistance and supervision    Time 8    Period Weeks    Status On-going      OT LONG TERM GOAL #3   Title Patient will transfer to toilet with supervision    Time 8    Period Weeks    Status On-going      OT LONG TERM GOAL #4   Title Patient will tansfer to tub bench with min assistance, then shower with modified independence    Time 8    Status On-going      OT LONG TERM GOAL #5   Title Patient will shave himself with modified independence    Time 8    Period Weeks    Status On-going                 Plan - 06/26/20 1254    Clinical Impression  Statement Patient showing improved participation in dressing activities    OT Occupational Profile and History Detailed Assessment- Review of Records and additional review of physical, cognitive, psychosocial history related to current functional performance    Occupational performance deficits (Please refer to evaluation for details): ADL's;IADL's    Body Structure / Function / Physical Skills ADL;Coordination;Endurance;GMC;UE functional use;Sensation;Decreased knowledge of precautions;Balance;Body mechanics;Decreased knowledge of use of DME;Flexibility;IADL;Pain;Strength;FMC;Dexterity;Cardiopulmonary status limiting activity;Gait;Mobility;ROM    Rehab Potential Good    Clinical Decision Making Several treatment options, min-mod task modification necessary    Comorbidities Affecting Occupational Performance: May have comorbidities impacting occupational performance    Modification or Assistance to Complete Evaluation  Min-Moderate modification of tasks or assist with assess necessary to complete eval    OT Frequency 2x / week    OT Duration 8 weeks    OT Treatment/Interventions Self-care/ADL training;Therapeutic exercise;Patient/family education;Neuromuscular education;Aquatic Therapy;Fluidtherapy;Building services engineer;Therapeutic activities;Balance training;Manual Therapy;DME and/or AE instruction    Plan Functional mobility - has difficulty with turns - consistent min to mod assist for any walking. UBE, continue to address ADL/strategies- how did LB dressing go?    OT Home Exercise Plan coordination, therapy putty, dowel exercises for BUE    Consulted and Agree with Plan of Care Patient;Family member/caregiver    Family Member Consulted son [name]           Patient will benefit from skilled therapeutic intervention in order to improve the following deficits and impairments:   Body Structure / Function / Physical Skills: ADL, Coordination, Endurance, GMC, UE functional use, Sensation,  Decreased knowledge of precautions, Balance, Body mechanics, Decreased knowledge of use of DME, Flexibility, IADL, Pain, Strength, FMC, Dexterity, Cardiopulmonary status limiting activity, Gait, Mobility, ROM       Visit Diagnosis: Muscle weakness (generalized)  Other symptoms and signs involving the nervous system  Unsteadiness on feet  Abnormal posture  Other disturbances of skin sensation  Chronic right shoulder  pain    Problem List There are no problems to display for this patient.   Collier Salina, OTR/L 06/26/2020, 12:57 PM  [facility] [address] [place], [place], [address] Phone: [phone]   Fax:  [phone]  Name: Wayne Dudley MRN: [id number] Date of Birth: May 31, 1923

## 2020-06-30 ENCOUNTER — Encounter: Payer: Non-veteran care | Admitting: Occupational Therapy

## 2020-07-01 ENCOUNTER — Encounter: Payer: Non-veteran care | Admitting: Occupational Therapy

## 2020-07-02 ENCOUNTER — Encounter: Payer: Non-veteran care | Admitting: Occupational Therapy

## 2020-07-03 ENCOUNTER — Encounter: Payer: Non-veteran care | Admitting: Occupational Therapy

## 2020-07-08 ENCOUNTER — Other Ambulatory Visit: Payer: Self-pay

## 2020-07-08 ENCOUNTER — Encounter: Payer: Self-pay | Admitting: Occupational Therapy

## 2020-07-08 ENCOUNTER — Ambulatory Visit: Payer: No Typology Code available for payment source | Attending: Psychiatry | Admitting: Occupational Therapy

## 2020-07-08 VITALS — BP 152/69 | HR 68

## 2020-07-08 DIAGNOSIS — R29818 Other symptoms and signs involving the nervous system: Secondary | ICD-10-CM | POA: Insufficient documentation

## 2020-07-08 DIAGNOSIS — R2681 Unsteadiness on feet: Secondary | ICD-10-CM

## 2020-07-08 DIAGNOSIS — R208 Other disturbances of skin sensation: Secondary | ICD-10-CM

## 2020-07-08 DIAGNOSIS — M6281 Muscle weakness (generalized): Secondary | ICD-10-CM | POA: Diagnosis not present

## 2020-07-08 DIAGNOSIS — R293 Abnormal posture: Secondary | ICD-10-CM | POA: Diagnosis present

## 2020-07-08 DIAGNOSIS — G8929 Other chronic pain: Secondary | ICD-10-CM | POA: Diagnosis present

## 2020-07-08 DIAGNOSIS — R209 Unspecified disturbances of skin sensation: Secondary | ICD-10-CM | POA: Insufficient documentation

## 2020-07-08 DIAGNOSIS — R2689 Other abnormalities of gait and mobility: Secondary | ICD-10-CM | POA: Insufficient documentation

## 2020-07-08 DIAGNOSIS — M25511 Pain in right shoulder: Secondary | ICD-10-CM | POA: Diagnosis present

## 2020-07-08 NOTE — Therapy (Signed)
[facility] [address] [place], [place], [address] Phone: [phone]   Fax:  [phone]  Occupational Therapy Treatment  Patient Details  Name: Wayne Dudley MRN: [id number] Date of Birth: April 10, 1923 Referring Provider (OT): Carrington Clamp   Encounter Date: 07/08/2020   OT End of Session - 07/08/20 1417    Visit Number 9    Number of Visits 15    Authorization Type VA - Auth 15 VISITS    Authorization - Visit Number 9    Authorization - Number of Visits 15    Progress Note Due on Visit 10    OT Start Time 1230    OT Stop Time 1315    OT Time Calculation (min) 45 min    Activity Tolerance Patient tolerated treatment well;Other (comment)    Behavior During Therapy WFL for tasks assessed/performed           History reviewed. No pertinent past medical history.  History reviewed. No pertinent surgical history.  Vitals:   07/08/20 1236  BP: (!) 152/69  Pulse: 68     Subjective Assessment - 07/08/20 1236    Subjective  I had 6 teeth removed since I last saw you    Patient is accompanied by: Family member    Pertinent History HTN, HBP, CKD2, CATARACT REMOVAL 2018, STENOSIS    Currently in Pain? Yes    Pain Score 6     Pain Location Foot    Pain Orientation Left    Pain Descriptors / Indicators Burning    Pain Type Chronic pain    Pain Onset More than a month ago    Pain Frequency Constant    Aggravating Factors  standing    Pain Relieving Factors tylenol                        OT Treatments/Exercises (OP) - 07/08/20 0001      ADLs   LB Dressing Worked on movement  components of lower body dressing - seated hip flexion with slight external rotation.  Seated flexion to feet, seated trunk rotation.  Patient has not been able to practice LB dressing secondary recent dental work restricting activity.      Functional Mobility Continue to work toward functional ambulation - patient does  well walking straigh line, needs cueing and min to mod assist for negotiating turns, backing up, etc.  Patient has difficulty moving walker and maintain active extension in hips and knees.        Neurological Re-education Exercises   Other Exercises 1 UBE X 2 min forward and 2 min backward level 2    Other Exercises 2 Standing and working resistive clothespins to emphasize single limb UE support for standing, and to address stand tolerance.                      OT Short Term Goals - 07/08/20 1419      OT SHORT TERM GOAL #1   Title Patient will complete a home exercise program for BUE with set up assistance    Baseline No current UE HEP    Time 4    Period Weeks    Status Achieved      OT SHORT TERM GOAL #2   Title Patient will dress his lower body with min assist    Baseline Mod assist    Time 4    Period Weeks    Status  On-going      OT SHORT TERM GOAL #3   Title Patient will put toothpaste on toothbrush with supervision assist    Baseline min assist    Time 4    Period Weeks    Status Achieved      OT SHORT TERM GOAL #4   Title Patient will transfer to toilet with minimal assist    Baseline mod assist    Period Weeks    Status On-going             OT Long Term Goals - 07/08/20 1419      OT LONG TERM GOAL #1   Title Patient will complete updated HEP independently    Status On-going      OT LONG TERM GOAL #2   Title Patient will dress lower body with set up assistance and supervision    Time 8    Period Weeks    Status On-going      OT LONG TERM GOAL #3   Title Patient will transfer to toilet with supervision    Time 8    Period Weeks    Status On-going      OT LONG TERM GOAL #4   Title Patient will tansfer to tub bench with min assistance, then shower with modified independence    Time 8    Status On-going      OT LONG TERM GOAL #5   Title Patient will shave himself with modified independence    Time 8    Period Weeks    Status On-going                  Plan - 07/08/20 1418    Clinical Impression Statement Patient slightly lethargic this session.  BP slightly elevated.  Patient recent had significant dental work - 6 teeth removed.    OT Occupational Profile and History Detailed Assessment- Review of Records and additional review of physical, cognitive, psychosocial history related to current functional performance    Occupational performance deficits (Please refer to evaluation for details): ADL's;IADL's    Body Structure / Function / Physical Skills ADL;Coordination;Endurance;GMC;UE functional use;Sensation;Decreased knowledge of precautions;Balance;Body mechanics;Decreased knowledge of use of DME;Flexibility;IADL;Pain;Strength;FMC;Dexterity;Cardiopulmonary status limiting activity;Gait;Mobility;ROM    Rehab Potential Good    Clinical Decision Making Several treatment options, min-mod task modification necessary    Comorbidities Affecting Occupational Performance: May have comorbidities impacting occupational performance    Modification or Assistance to Complete Evaluation  Min-Moderate modification of tasks or assist with assess necessary to complete eval    OT Frequency 2x / week    OT Duration 8 weeks    OT Treatment/Interventions Self-care/ADL training;Therapeutic exercise;Patient/family education;Neuromuscular education;Aquatic Therapy;Fluidtherapy;Building services engineer;Therapeutic activities;Balance training;Manual Therapy;DME and/or AE instruction    Plan Functional mobility - has difficulty with turns - consistent min to mod assist for any walking. UBE, continue to address ADL/strategies- how did LB dressing go?    OT Home Exercise Plan coordination, therapy putty, dowel exercises for BUE    Consulted and Agree with Plan of Care Patient;Family member/caregiver    Family Member Consulted son [name]           Patient will benefit from skilled therapeutic intervention in order to improve the following  deficits and impairments:   Body Structure / Function / Physical Skills: ADL, Coordination, Endurance, GMC, UE functional use, Sensation, Decreased knowledge of precautions, Balance, Body mechanics, Decreased knowledge of use of DME, Flexibility, IADL, Pain, Strength, FMC, Dexterity, Cardiopulmonary status limiting activity,  Gait, Mobility, ROM       Visit Diagnosis: Muscle weakness (generalized)  Other symptoms and signs involving the nervous system  Unsteadiness on feet  Abnormal posture  Other disturbances of skin sensation  Chronic right shoulder pain    Problem List There are no problems to display for this patient.   Collier Salina, OTR/L 07/08/2020, 2:20 PM  [facility] [address] [place], [place], [address] Phone: [phone]   Fax:  [phone]  Name: Hermann Dottavio MRN: [id number] Date of Birth: December 25, 1922

## 2020-07-10 ENCOUNTER — Other Ambulatory Visit: Payer: Self-pay

## 2020-07-10 ENCOUNTER — Ambulatory Visit: Payer: No Typology Code available for payment source | Admitting: Occupational Therapy

## 2020-07-10 ENCOUNTER — Encounter: Payer: Self-pay | Admitting: Occupational Therapy

## 2020-07-10 DIAGNOSIS — M25511 Pain in right shoulder: Secondary | ICD-10-CM

## 2020-07-10 DIAGNOSIS — R2681 Unsteadiness on feet: Secondary | ICD-10-CM

## 2020-07-10 DIAGNOSIS — R29818 Other symptoms and signs involving the nervous system: Secondary | ICD-10-CM

## 2020-07-10 DIAGNOSIS — R208 Other disturbances of skin sensation: Secondary | ICD-10-CM

## 2020-07-10 DIAGNOSIS — M6281 Muscle weakness (generalized): Secondary | ICD-10-CM

## 2020-07-10 DIAGNOSIS — R293 Abnormal posture: Secondary | ICD-10-CM

## 2020-07-10 NOTE — Therapy (Signed)
Wayne Dud[facility] [address] [place], [place], [address] Phone: [phone]   Fax:  [phone]  Occupational Therapy Treatment and Progress Update  Patient Details  Name: Wayne Dudley MRN: [id number] Date of Birth: 12-29-1922 Referring Provider (OT): Wayne Dudley  This note covers dates of service from 7/6- 07/10/20 Encounter Date: 07/10/2020   OT End of Session - 07/10/20 1228    Visit Number 10    Number of Visits 15    Authorization Type VA - Auth 15 VISITS    Authorization - Visit Number 10    Authorization - Number of Visits 15    Progress Note Due on Visit 10    OT Start Time 1145    OT Stop Time 1225    OT Time Calculation (min) 40 min    Activity Tolerance Patient tolerated treatment well    Behavior During Therapy Wayne Dudley for tasks assessed/performed           History reviewed. No pertinent past medical history.  History reviewed. No pertinent surgical history.  There were no vitals filed for this visit.   Subjective Assessment - 07/10/20 1151    Subjective  I am moving slowly, but I feel good about that    Patient is accompanied by: Family member    Pertinent History HTN, HBP, CKD2, CATARACT REMOVAL 2018, STENOSIS    Currently in Pain? Yes    Pain Score 6     Pain Location Foot    Pain Orientation Left    Pain Descriptors / Indicators Burning    Pain Type Chronic pain    Pain Onset More than a month ago    Pain Frequency Constant    Aggravating Factors  sitting    Pain Relieving Factors tylenol                        OT Treatments/Exercises (OP) - 07/10/20 0001      ADLs   Functional Mobility Patient still with goal of walking into bathroom.  Practiced walking forward ~ 20 ft, then turning toward chair with arms.  Patient had difficulty turning to right after 20 ft walk, however was able to turn to left without difficulty - with min assist.        Hand Exercises   Other Hand  Exercises Gripper on lowest setting, to address hand strength.  Tested grip strength and noted slight decrease from evaluation.  Also worked digiflex 1.5, and 3.0 lbs resistance BUE                    OT Short Term Goals - 07/10/20 1229      OT SHORT TERM GOAL #1   Title Patient will complete a home exercise program for BUE with set up assistance    Baseline No current UE HEP    Time 4    Period Weeks    Status Achieved      OT SHORT TERM GOAL #2   Title Patient will dress his lower body with min assist    Baseline Mod assist    Time 4    Period Weeks    Status On-going      OT SHORT TERM GOAL #3   Title Patient will put toothpaste on toothbrush with supervision assist    Baseline min assist    Time 4    Period Weeks    Status Achieved  OT SHORT TERM GOAL #4   Title Patient will transfer to toilet with minimal assist    Baseline mod assist    Period Weeks    Status On-going             OT Long Term Goals - 07/10/20 1230      OT LONG TERM GOAL #1   Title Patient will complete updated HEP independently    Status On-going      OT LONG TERM GOAL #2   Title Patient will dress lower body with set up assistance and supervision    Time 8    Period Weeks    Status On-going      OT LONG TERM GOAL #3   Title Patient will transfer to toilet with supervision    Time 8    Period Weeks    Status On-going      OT LONG TERM GOAL #4   Title Patient will tansfer to tub bench with min assistance, then shower with modified independence    Time 8    Status On-going      OT LONG TERM GOAL #5   Title Patient will shave himself with modified independence    Time 8    Period Weeks    Status On-going                 Plan - 07/10/20 1228    Clinical Impression Statement Patient with slight decrease in grip strength, and improving functional mobility    OT Occupational Profile and History Detailed Assessment- Review of Records and additional review of  physical, cognitive, psychosocial history related to current functional performance    Occupational performance deficits (Please refer to evaluation for details): ADL's;IADL's    Body Structure / Function / Physical Skills ADL;Coordination;Endurance;GMC;UE functional use;Sensation;Decreased knowledge of precautions;Balance;Body mechanics;Decreased knowledge of use of DME;Flexibility;IADL;Pain;Strength;FMC;Dexterity;Cardiopulmonary status limiting activity;Gait;Mobility;ROM    Rehab Potential Good    Clinical Decision Making Several treatment options, min-mod task modification necessary    Comorbidities Affecting Occupational Performance: May have comorbidities impacting occupational performance    Modification or Assistance to Complete Evaluation  Min-Moderate modification of tasks or assist with assess necessary to complete eval    OT Frequency 2x / week    OT Duration 8 weeks    OT Treatment/Interventions Self-care/ADL training;Therapeutic exercise;Patient/family education;Neuromuscular education;Aquatic Therapy;Fluidtherapy;Building services engineer;Therapeutic activities;Balance training;Manual Therapy;DME and/or AE instruction    Plan Functional mobility - has difficulty with turns - consistent min to mod assist for any walking. UBE, continue to address ADL/strategies- how did LB dressing go?    OT Home Exercise Plan coordination, therapy putty, dowel exercises for BUE    Consulted and Agree with Plan of Care Patient;Family member/caregiver    Family Member Consulted son Wayne Dudley           Patient will benefit from skilled therapeutic intervention in order to improve the following deficits and impairments:   Body Structure / Function / Physical Skills: ADL, Coordination, Endurance, GMC, UE functional use, Sensation, Decreased knowledge of precautions, Balance, Body mechanics, Decreased knowledge of use of DME, Flexibility, IADL, Pain, Strength, FMC, Dexterity, Cardiopulmonary status limiting  activity, Gait, Mobility, ROM       Visit Diagnosis: Muscle weakness (generalized)  Other symptoms and signs involving the nervous system  Unsteadiness on feet  Abnormal posture  Other disturbances of skin sensation  Chronic right shoulder pain    Problem List There are no problems to display for this patient.   Lindzee Gouge,  Lanice Shirts, OTR/L 07/10/2020, 12:31 PM  [facility] [address] [place], [place], [address] Phone: [phone]   Fax:  [phone]  Name: Wayne Dudley MRN: [id number] Date of Birth: 1923/08/23

## 2020-07-14 ENCOUNTER — Ambulatory Visit: Payer: No Typology Code available for payment source

## 2020-07-14 ENCOUNTER — Encounter: Payer: Non-veteran care | Admitting: Occupational Therapy

## 2020-07-15 ENCOUNTER — Encounter: Payer: Non-veteran care | Admitting: Occupational Therapy

## 2020-07-16 ENCOUNTER — Ambulatory Visit: Payer: No Typology Code available for payment source | Admitting: Occupational Therapy

## 2020-07-16 ENCOUNTER — Other Ambulatory Visit: Payer: Self-pay

## 2020-07-16 ENCOUNTER — Ambulatory Visit: Payer: No Typology Code available for payment source | Admitting: Physical Therapy

## 2020-07-16 ENCOUNTER — Encounter: Payer: Self-pay | Admitting: Occupational Therapy

## 2020-07-16 DIAGNOSIS — R29818 Other symptoms and signs involving the nervous system: Secondary | ICD-10-CM

## 2020-07-16 DIAGNOSIS — R208 Other disturbances of skin sensation: Secondary | ICD-10-CM

## 2020-07-16 DIAGNOSIS — R2681 Unsteadiness on feet: Secondary | ICD-10-CM

## 2020-07-16 DIAGNOSIS — R293 Abnormal posture: Secondary | ICD-10-CM

## 2020-07-16 DIAGNOSIS — M6281 Muscle weakness (generalized): Secondary | ICD-10-CM

## 2020-07-16 DIAGNOSIS — G8929 Other chronic pain: Secondary | ICD-10-CM

## 2020-07-16 NOTE — Therapy (Signed)
[facility] [address] [place], [place], [address] Phone: [phone]   Fax:  [phone]  Occupational Therapy Treatment  Patient Details  Name: Wayne Dudley MRN: [id number] Date of Birth: 1923-08-07 Referring Provider (OT): Carrington Clamp   Encounter Date: 07/16/2020   OT End of Session - 07/16/20 1317    Visit Number 11    Number of Visits 15    Authorization Type VA - Auth 15 VISITS    Progress Note Due on Visit 20    OT Start Time 1230    OT Stop Time 1312    OT Time Calculation (min) 42 min    Activity Tolerance Patient tolerated treatment well    Behavior During Therapy [facility] for tasks assessed/performed           History reviewed. No pertinent past medical history.  History reviewed. No pertinent surgical history.  There were no vitals filed for this visit.   Subjective Assessment - 07/16/20 1236    Subjective  It seems like my right leg is dragging.    Patient is accompanied by: Family member    Pertinent History HTN, HBP, CKD2, CATARACT REMOVAL 2018, STENOSIS    Currently in Pain? Yes    Pain Score 7     Pain Location Foot    Pain Orientation Left    Pain Descriptors / Indicators Burning    Pain Type Chronic pain    Pain Onset More than a month ago    Pain Frequency Constant    Aggravating Factors  sitting    Pain Relieving Factors tylenol                        OT Treatments/Exercises (OP) - 07/16/20 0001      ADLs   Functional Mobility Working to improve standing tolerance and decreased reliance on UE's for standing.  Today, initially patient stood only 10-15 seconds.  With adjustments to foot placement and wheelchait placement to allow bending forward.  Patient able to stand 1.34 min, then 2.07 min, then 2.28 minutes while completing graded clothespins.  Followed with brief walk toward chair - working on turning and backing up as needed for toileting.  Patient able to  turnm twice with min assist.                      OT Short Term Goals - 07/10/20 1229      OT SHORT TERM GOAL #1   Title Patient will complete a home exercise program for BUE with set up assistance    Baseline No current UE HEP    Time 4    Period Weeks    Status Achieved      OT SHORT TERM GOAL #2   Title Patient will dress his lower body with min assist    Baseline Mod assist    Time 4    Period Weeks    Status On-going      OT SHORT TERM GOAL #3   Title Patient will put toothpaste on toothbrush with supervision assist    Baseline min assist    Time 4    Period Weeks    Status Achieved      OT SHORT TERM GOAL #4   Title Patient will transfer to toilet with minimal assist    Baseline mod assist    Period Weeks    Status On-going  OT Long Term Goals - 07/10/20 1230      OT LONG TERM GOAL #1   Title Patient will complete updated HEP independently    Status On-going      OT LONG TERM GOAL #2   Title Patient will dress lower body with set up assistance and supervision    Time 8    Period Weeks    Status On-going      OT LONG TERM GOAL #3   Title Patient will transfer to toilet with supervision    Time 8    Period Weeks    Status On-going      OT LONG TERM GOAL #4   Title Patient will tansfer to tub bench with min assistance, then shower with modified independence    Time 8    Status On-going      OT LONG TERM GOAL #5   Title Patient will shave himself with modified independence    Time 8    Period Weeks    Status On-going                 Plan - 07/16/20 1317    Clinical Impression Statement Patient remains participative, and motivated for functional improvment    OT Occupational Profile and History Detailed Assessment- Review of Records and additional review of physical, cognitive, psychosocial history related to current functional performance    Occupational performance deficits (Please refer to evaluation for details):  ADL's;IADL's    Body Structure / Function / Physical Skills ADL;Coordination;Endurance;GMC;UE functional use;Sensation;Decreased knowledge of precautions;Balance;Body mechanics;Decreased knowledge of use of DME;Flexibility;IADL;Pain;Strength;FMC;Dexterity;Cardiopulmonary status limiting activity;Gait;Mobility;ROM    Rehab Potential Good    Clinical Decision Making Several treatment options, min-mod task modification necessary    Comorbidities Affecting Occupational Performance: May have comorbidities impacting occupational performance    Modification or Assistance to Complete Evaluation  Min-Moderate modification of tasks or assist with assess necessary to complete eval    OT Frequency 2x / week    OT Duration 8 weeks    OT Treatment/Interventions Self-care/ADL training;Therapeutic exercise;Patient/family education;Neuromuscular education;Aquatic Therapy;Fluidtherapy;Building services engineer;Therapeutic activities;Balance training;Manual Therapy;DME and/or AE instruction    Plan Functional mobility - has difficulty with turns - consistent min to mod assist for any walking. UBE, continue to address ADL/strategies- how did LB dressing go?    OT Home Exercise Plan coordination, therapy putty, dowel exercises for BUE    Consulted and Agree with Plan of Care Patient;Family member/caregiver    Family Member Consulted son [name]           Patient will benefit from skilled therapeutic intervention in order to improve the following deficits and impairments:   Body Structure / Function / Physical Skills: ADL, Coordination, Endurance, GMC, UE functional use, Sensation, Decreased knowledge of precautions, Balance, Body mechanics, Decreased knowledge of use of DME, Flexibility, IADL, Pain, Strength, FMC, Dexterity, Cardiopulmonary status limiting activity, Gait, Mobility, ROM       Visit Diagnosis: Muscle weakness (generalized)  Other symptoms and signs involving the nervous system  Unsteadiness  on feet  Abnormal posture  Other disturbances of skin sensation  Chronic right shoulder pain    Problem List There are no problems to display for this patient.   Collier Salina, OTR/L 07/16/2020, 1:18 PM  [facility] [address] [place], [place], [address] Phone: [phone]   Fax:  [phone]  Name: Wayne Dudley MRN: [id number] Date of Birth: 1923-03-17

## 2020-07-17 ENCOUNTER — Encounter: Payer: Non-veteran care | Admitting: Occupational Therapy

## 2020-07-22 ENCOUNTER — Ambulatory Visit: Payer: No Typology Code available for payment source | Admitting: Physical Therapy

## 2020-07-22 ENCOUNTER — Ambulatory Visit: Payer: No Typology Code available for payment source | Admitting: Occupational Therapy

## 2020-07-22 ENCOUNTER — Encounter: Payer: Self-pay | Admitting: Occupational Therapy

## 2020-07-22 ENCOUNTER — Other Ambulatory Visit: Payer: Self-pay

## 2020-07-22 DIAGNOSIS — R29818 Other symptoms and signs involving the nervous system: Secondary | ICD-10-CM

## 2020-07-22 DIAGNOSIS — R208 Other disturbances of skin sensation: Secondary | ICD-10-CM

## 2020-07-22 DIAGNOSIS — R2681 Unsteadiness on feet: Secondary | ICD-10-CM

## 2020-07-22 DIAGNOSIS — M6281 Muscle weakness (generalized): Secondary | ICD-10-CM

## 2020-07-22 DIAGNOSIS — R293 Abnormal posture: Secondary | ICD-10-CM

## 2020-07-22 DIAGNOSIS — G8929 Other chronic pain: Secondary | ICD-10-CM

## 2020-07-22 NOTE — Therapy (Signed)
[facility] [address] [place], [place], [address] Phone: [phone]   Fax:  [phone]  Occupational Therapy Treatment  Patient Details  Name: Wayne Dudley MRN: [id number] Date of Birth: 1923/02/19 Referring Provider (OT): Carrington Clamp   Encounter Date: 07/22/2020   OT End of Session - 07/22/20 1452    Visit Number 12    Number of Visits 15    Authorization Type VA - Auth 15 VISITS    Authorization - Visit Number 11    Authorization - Number of Visits 15    Progress Note Due on Visit 20    OT Start Time 1100    OT Stop Time 1140    OT Time Calculation (min) 40 min    Activity Tolerance Patient tolerated treatment well    Behavior During Therapy [facility] for tasks assessed/performed           History reviewed. No pertinent past medical history.  History reviewed. No pertinent surgical history.  There were no vitals filed for this visit.   Subjective Assessment - 07/22/20 1104    Subjective  Pain is a little bit down today    Patient is accompanied by: Family member    Pertinent History HTN, HBP, CKD2, CATARACT REMOVAL 2018, STENOSIS    Currently in Pain? Yes    Pain Score 6     Pain Location Foot    Pain Orientation Left    Pain Descriptors / Indicators Burning    Pain Type Chronic pain    Pain Frequency Constant    Aggravating Factors  sitting    Pain Relieving Factors tylenol                        OT Treatments/Exercises (OP) - 07/22/20 0001      ADLs   Functional Mobility Continuing to show improved functional mobility .  Patient required initial cueing and facilitation to stabilize base of support - feet in stance, when needing to lift and adjust walker.  Patient able to follow through consistently and then required only contact assistance for turning, reversing, sidestepping around obstacles.  Patient in seated position woking on reaching to feet, lifting feet up.  Worked on  controlled sit to stand and stand to sit with one UE support.  Also worked on brief single leg stance to kick a ball alternating feet.                      OT Short Term Goals - 07/22/20 1454      OT SHORT TERM GOAL #1   Title Patient will complete a home exercise program for BUE with set up assistance    Baseline No current UE HEP    Time 4    Period Weeks    Status Achieved      OT SHORT TERM GOAL #2   Title Patient will dress his lower body with min assist    Baseline Mod assist    Time 4    Period Weeks    Status On-going      OT SHORT TERM GOAL #3   Title Patient will put toothpaste on toothbrush with supervision assist    Baseline min assist    Time 4    Period Weeks    Status Achieved      OT SHORT TERM GOAL #4   Title Patient will transfer to toilet with minimal assist  Baseline mod assist    Period Weeks    Status Achieved   Patient can stand, walk, turn, and back up to chair in clinic.  Has not completed in home environment            OT Long Term Goals - 07/22/20 1455      OT LONG TERM GOAL #1   Title Patient will complete updated HEP independently    Status On-going      OT LONG TERM GOAL #2   Title Patient will dress lower body with set up assistance and supervision    Time 8    Period Weeks    Status Deferred      OT LONG TERM GOAL #3   Title Patient will transfer to toilet with supervision    Time 8    Period Weeks    Status Deferred      OT LONG TERM GOAL #4   Title Patient will tansfer to tub bench with min assistance, then shower with modified independence    Time 8    Status On-going      OT LONG TERM GOAL #5   Title Patient will shave himself with modified independence    Time 8    Period Weeks    Status On-going                 Plan - 07/22/20 1453    Clinical Impression Statement Patient shows steady improvement in his ability to walk functionally as needed for bathroom transfers at home.    OT  Occupational Profile and History Detailed Assessment- Review of Records and additional review of physical, cognitive, psychosocial history related to current functional performance    Occupational performance deficits (Please refer to evaluation for details): ADL's;IADL's    Body Structure / Function / Physical Skills ADL;Coordination;Endurance;GMC;UE functional use;Sensation;Decreased knowledge of precautions;Balance;Body mechanics;Decreased knowledge of use of DME;Flexibility;IADL;Pain;Strength;FMC;Dexterity;Cardiopulmonary status limiting activity;Gait;Mobility;ROM    Rehab Potential Good    Clinical Decision Making Several treatment options, min-mod task modification necessary    Comorbidities Affecting Occupational Performance: May have comorbidities impacting occupational performance    Modification or Assistance to Complete Evaluation  Min-Moderate modification of tasks or assist with assess necessary to complete eval    OT Frequency 2x / week    OT Duration 8 weeks    OT Treatment/Interventions Self-care/ADL training;Therapeutic exercise;Patient/family education;Neuromuscular education;Aquatic Therapy;Fluidtherapy;Building services engineer;Therapeutic activities;Balance training;Manual Therapy;DME and/or AE instruction    Plan Functional mobility - has difficulty with turns - consistent min to mod assist for any walking. UBE, continue to address ADL/strategies- how did LB dressing go?    OT Home Exercise Plan coordination, therapy putty, dowel exercises for BUE    Consulted and Agree with Plan of Care Patient;Family member/caregiver    Family Member Consulted son [name]           Patient will benefit from skilled therapeutic intervention in order to improve the following deficits and impairments:   Body Structure / Function / Physical Skills: ADL, Coordination, Endurance, GMC, UE functional use, Sensation, Decreased knowledge of precautions, Balance, Body mechanics, Decreased knowledge  of use of DME, Flexibility, IADL, Pain, Strength, FMC, Dexterity, Cardiopulmonary status limiting activity, Gait, Mobility, ROM       Visit Diagnosis: Muscle weakness (generalized)  Other symptoms and signs involving the nervous system  Unsteadiness on feet  Abnormal posture  Other disturbances of skin sensation  Chronic right shoulder pain    Problem List There are no problems to  display for this patient.   Collier Salina , OTR/L 07/22/2020, 2:56 PM  [facility] [address] [place], [place], [address] Phone: [phone]   Fax:  [phone]  Name: Saman Umstead MRN: [id number] Date of Birth: 10/05/1923

## 2020-07-24 ENCOUNTER — Ambulatory Visit: Payer: No Typology Code available for payment source | Admitting: Occupational Therapy

## 2020-07-24 ENCOUNTER — Other Ambulatory Visit: Payer: Self-pay

## 2020-07-24 DIAGNOSIS — R29818 Other symptoms and signs involving the nervous system: Secondary | ICD-10-CM

## 2020-07-24 DIAGNOSIS — M6281 Muscle weakness (generalized): Secondary | ICD-10-CM

## 2020-07-24 DIAGNOSIS — R2689 Other abnormalities of gait and mobility: Secondary | ICD-10-CM

## 2020-07-24 DIAGNOSIS — R208 Other disturbances of skin sensation: Secondary | ICD-10-CM

## 2020-07-24 DIAGNOSIS — R2681 Unsteadiness on feet: Secondary | ICD-10-CM

## 2020-07-24 DIAGNOSIS — R293 Abnormal posture: Secondary | ICD-10-CM

## 2020-07-24 NOTE — Therapy (Signed)
[facility] [address] [place], [place], [address] Phone: [phone]   Fax:  [phone]  Occupational Therapy Treatment  Patient Details  Name: Wayne Dudley MRN: [id number] Date of Birth: 1922/11/25 Referring Provider (OT): Carrington Clamp   Encounter Date: 07/24/2020   OT End of Session - 07/24/20 1152    Visit Number 13    Number of Visits 15    Authorization Type VA - Auth 15 VISITS approved through 07/23/20- additional visits have been requested per [place]    Authorization - Visit Number 13    Authorization - Number of Visits 15    Progress Note Due on Visit 20    OT Start Time 1148    OT Stop Time 1228    OT Time Calculation (min) 40 min    Activity Tolerance Patient tolerated treatment well    Behavior During Therapy [facility] for tasks assessed/performed           No past medical history on file.  No past surgical history on file.  There were no vitals filed for this visit.            Treatment: Ambulating with RW and mod A  to simulate ambulation to toilet, mod v.c/ facilitation for advancing LLE Standing at tabletop for lateral weight shifts then reaching with right and left UE's during functional task, mod A Seated functional reach with sustained pinch to place graded clothespins on vertical antennae with right and left UE's,  Arm bike x 8 mins level 1 for conditioning, pt transferred to and from seat with walker and mod A.               OT Short Term Goals - 07/22/20 1454      OT SHORT TERM GOAL #1   Title Patient will complete a home exercise program for BUE with set up assistance    Baseline No current UE HEP    Time 4    Period Weeks    Status Achieved      OT SHORT TERM GOAL #2   Title Patient will dress his lower body with min assist    Baseline Mod assist    Time 4    Period Weeks    Status On-going      OT SHORT TERM GOAL #3   Title Patient will put toothpaste  on toothbrush with supervision assist    Baseline min assist    Time 4    Period Weeks    Status Achieved      OT SHORT TERM GOAL #4   Title Patient will transfer to toilet with minimal assist    Baseline mod assist    Period Weeks    Status Achieved   Patient can stand, walk, turn, and back up to chair in clinic.  Has not completed in home environment            OT Long Term Goals - 07/22/20 1455      OT LONG TERM GOAL #1   Title Patient will complete updated HEP independently    Status On-going      OT LONG TERM GOAL #2   Title Patient will dress lower body with set up assistance and supervision    Time 8    Period Weeks    Status Deferred      OT LONG TERM GOAL #3   Title Patient will transfer to toilet with supervision    Time 8  Period Weeks    Status Deferred      OT LONG TERM GOAL #4   Title Patient will tansfer to tub bench with min assistance, then shower with modified independence    Time 8    Status On-going      OT LONG TERM GOAL #5   Title Patient will shave himself with modified independence    Time 8    Period Weeks    Status On-going                 Plan - 07/24/20 1151    Clinical Impression Statement Patient is progressing towards goals for functional mobility and UE strength/ functional use.    OT Occupational Profile and History Detailed Assessment- Review of Records and additional review of physical, cognitive, psychosocial history related to current functional performance    Occupational performance deficits (Please refer to evaluation for details): ADL's;IADL's    Body Structure / Function / Physical Skills ADL;Coordination;Endurance;GMC;UE functional use;Sensation;Decreased knowledge of precautions;Balance;Body mechanics;Decreased knowledge of use of DME;Flexibility;IADL;Pain;Strength;FMC;Dexterity;Cardiopulmonary status limiting activity;Gait;Mobility;ROM    Rehab Potential Good    Clinical Decision Making Several treatment  options, min-mod task modification necessary    Comorbidities Affecting Occupational Performance: May have comorbidities impacting occupational performance    Modification or Assistance to Complete Evaluation  Min-Moderate modification of tasks or assist with assess necessary to complete eval    OT Frequency 2x / week    OT Duration 8 weeks    OT Treatment/Interventions Self-care/ADL training;Therapeutic exercise;Patient/family education;Neuromuscular education;Aquatic Therapy;Fluidtherapy;Building services engineer;Therapeutic activities;Balance training;Manual Therapy;DME and/or AE instruction    Plan Functional mobility - has difficulty with turns - consistent min to mod assist for any walking. UBE, continue to address ADL/strategies-    OT Home Exercise Plan coordination, therapy putty, dowel exercises for BUE    Consulted and Agree with Plan of Care Patient;Family member/caregiver    Family Member Consulted son [name]           Patient will benefit from skilled therapeutic intervention in order to improve the following deficits and impairments:   Body Structure / Function / Physical Skills: ADL, Coordination, Endurance, GMC, UE functional use, Sensation, Decreased knowledge of precautions, Balance, Body mechanics, Decreased knowledge of use of DME, Flexibility, IADL, Pain, Strength, FMC, Dexterity, Cardiopulmonary status limiting activity, Gait, Mobility, ROM       Visit Diagnosis: Muscle weakness (generalized)  Other symptoms and signs involving the nervous system  Unsteadiness on feet  Abnormal posture  Other abnormalities of gait and mobility  Other disturbances of skin sensation    Problem List There are no problems to display for this patient.   Wayne Dudley 07/24/2020, 1:42 PM  [place] [address] [place], [place], [address] Phone: [phone]   Fax:  [phone]  Name: Wayne Dudley MRN: [id number] Date of Birth: 04/08/1923

## 2020-07-26 ENCOUNTER — Other Ambulatory Visit: Payer: Self-pay

## 2020-07-26 ENCOUNTER — Emergency Department (HOSPITAL_BASED_OUTPATIENT_CLINIC_OR_DEPARTMENT_OTHER)
Admission: EM | Admit: 2020-07-26 | Discharge: 2020-07-26 | Disposition: A | Discharge status: Home / Self Care | Payer: No Typology Code available for payment source | Attending: Emergency Medicine | Admitting: Emergency Medicine

## 2020-07-26 ENCOUNTER — Emergency Department (HOSPITAL_BASED_OUTPATIENT_CLINIC_OR_DEPARTMENT_OTHER): Payer: No Typology Code available for payment source

## 2020-07-26 ENCOUNTER — Encounter (HOSPITAL_BASED_OUTPATIENT_CLINIC_OR_DEPARTMENT_OTHER): Payer: Self-pay | Admitting: Emergency Medicine

## 2020-07-26 DIAGNOSIS — K5641 Fecal impaction: Secondary | ICD-10-CM | POA: Diagnosis not present

## 2020-07-26 DIAGNOSIS — Z7982 Long term (current) use of aspirin: Secondary | ICD-10-CM | POA: Diagnosis not present

## 2020-07-26 DIAGNOSIS — I10 Essential (primary) hypertension: Secondary | ICD-10-CM | POA: Diagnosis not present

## 2020-07-26 DIAGNOSIS — Z79899 Other long term (current) drug therapy: Secondary | ICD-10-CM | POA: Insufficient documentation

## 2020-07-26 DIAGNOSIS — K5900 Constipation, unspecified: Secondary | ICD-10-CM | POA: Diagnosis present

## 2020-07-26 HISTORY — DX: Essential (primary) hypertension: I10

## 2020-07-26 HISTORY — DX: Constipation, unspecified: K59.00

## 2020-07-26 LAB — CBC WITH DIFFERENTIAL/PLATELET
Abs Immature Granulocytes: 0.02 10*3/uL (ref 0.00–0.07)
Basophils Absolute: 0 10*3/uL (ref 0.0–0.1)
Basophils Relative: 0 %
Eosinophils Absolute: 0 10*3/uL (ref 0.0–0.5)
Eosinophils Relative: 0 %
HCT: 36.7 % — ABNORMAL LOW (ref 39.0–52.0)
Hemoglobin: 11.9 g/dL — ABNORMAL LOW (ref 13.0–17.0)
Immature Granulocytes: 0 %
Lymphocytes Relative: 13 %
Lymphs Abs: 0.8 10*3/uL (ref 0.7–4.0)
MCH: 29.9 pg (ref 26.0–34.0)
MCHC: 32.4 g/dL (ref 30.0–36.0)
MCV: 92.2 fL (ref 80.0–100.0)
Monocytes Absolute: 0.5 10*3/uL (ref 0.1–1.0)
Monocytes Relative: 9 %
Neutro Abs: 4.5 10*3/uL (ref 1.7–7.7)
Neutrophils Relative %: 78 %
Platelets: 274 10*3/uL (ref 150–400)
RBC: 3.98 MIL/uL — ABNORMAL LOW (ref 4.22–5.81)
RDW: 13.8 % (ref 11.5–15.5)
WBC: 5.8 10*3/uL (ref 4.0–10.5)
nRBC: 0 % (ref 0.0–0.2)

## 2020-07-26 LAB — COMPREHENSIVE METABOLIC PANEL
ALT: 24 U/L (ref 0–44)
AST: 28 U/L (ref 15–41)
Albumin: 3.6 g/dL (ref 3.5–5.0)
Alkaline Phosphatase: 59 U/L (ref 38–126)
Anion gap: 14 (ref 5–15)
BUN: 23 mg/dL (ref 8–23)
CO2: 31 mmol/L (ref 22–32)
Calcium: 9.2 mg/dL (ref 8.9–10.3)
Chloride: 90 mmol/L — ABNORMAL LOW (ref 98–111)
Creatinine, Ser: 1.27 mg/dL — ABNORMAL HIGH (ref 0.61–1.24)
GFR calc Af Amer: 56 mL/min — ABNORMAL LOW (ref >60)
GFR calc non Af Amer: 48 mL/min — ABNORMAL LOW (ref >60)
Glucose, Bld: 119 mg/dL — ABNORMAL HIGH (ref 70–99)
Potassium: 3.5 mmol/L (ref 3.5–5.1)
Sodium: 135 mmol/L (ref 135–145)
Total Bilirubin: 0.4 mg/dL (ref 0.3–1.2)
Total Protein: 7.7 g/dL (ref 6.5–8.1)

## 2020-07-26 LAB — LIPASE, BLOOD: Lipase: 39 U/L (ref 11–51)

## 2020-07-26 MED ORDER — FLEET ENEMA 7-19 GM/118ML RE ENEM
1.0000 | ENEMA | Freq: Once | RECTAL | Status: AC
  Administered 2020-07-26: 1 via RECTAL
  Filled 2020-07-26: qty 1

## 2020-07-26 NOTE — ED Notes (Signed)
Pt on bedside commode.

## 2020-07-26 NOTE — ED Notes (Signed)
Pt discharged to home. Discharge instructions have been discussed with patient and/or family members. Pt verbally acknowledges understanding d/c instructions, and endorses comprehension to checkout at registration before leaving.  °

## 2020-07-26 NOTE — ED Notes (Signed)
Patient transported to X-ray 
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
## 2020-07-26 NOTE — ED Triage Notes (Signed)
Pt here with constipation x 3 days. No pain, just pressure. Hx of the same. Has tried multiple home tx with no relief.

## 2020-07-26 NOTE — ED Notes (Signed)
Pt reports inability to pass stool, requests enema. EDP [place] notified

## 2020-07-26 NOTE — ED Notes (Signed)
Chaperone EDP [place], [place] with fecal disimpaction. Pt placed on bedside commode to attempt to have BM

## 2020-07-26 NOTE — Discharge Instructions (Addendum)
Please increase her water and fiber intake. Continue taking a daily stool softener. You can increase your doses of MiraLAX to help you continue to produce bowel movements.   Please follow closely with your primary care provider regarding your visit today. Return to the emergency department if you develop severely worsening abdominal pain or worsening constipation not relieved by home management.

## 2020-07-29 ENCOUNTER — Other Ambulatory Visit: Payer: Self-pay

## 2020-07-29 ENCOUNTER — Ambulatory Visit: Payer: No Typology Code available for payment source

## 2020-07-29 ENCOUNTER — Encounter: Payer: Self-pay | Admitting: Occupational Therapy

## 2020-07-29 ENCOUNTER — Ambulatory Visit: Payer: No Typology Code available for payment source | Admitting: Occupational Therapy

## 2020-07-29 DIAGNOSIS — R293 Abnormal posture: Secondary | ICD-10-CM

## 2020-07-29 DIAGNOSIS — M6281 Muscle weakness (generalized): Secondary | ICD-10-CM

## 2020-07-29 DIAGNOSIS — R2681 Unsteadiness on feet: Secondary | ICD-10-CM

## 2020-07-29 DIAGNOSIS — M25511 Pain in right shoulder: Secondary | ICD-10-CM

## 2020-07-29 DIAGNOSIS — R29818 Other symptoms and signs involving the nervous system: Secondary | ICD-10-CM

## 2020-07-29 DIAGNOSIS — R208 Other disturbances of skin sensation: Secondary | ICD-10-CM

## 2020-07-29 NOTE — Therapy (Signed)
[facility] [address] [place], [place], [address] Phone: [phone]   Fax:  [phone]  Occupational Therapy Treatment  Patient Details  Name: Wayne Dudley MRN: [id number] Date of Birth: [birth year] Referring Provider (OT): Carrington Clamp   Encounter Date: 07/29/2020   OT End of Session - 07/29/20 1434    Visit Number 14    Number of Visits 15    Authorization Type [place] - Auth 15 VISITS approved through 07/23/20- additional visits have been requested per [place]    Authorization - Visit Number 14    Authorization - Number of Visits 15    Progress Note Due on Visit 20    OT Start Time 1232    OT Stop Time 1315    OT Time Calculation (min) 43 min    Activity Tolerance Patient tolerated treatment well    Behavior During Therapy [facility] for tasks assessed/performed           Past Medical History:  Diagnosis Date  . Constipation   . Hypertension     History reviewed. No pertinent surgical history.  There were no vitals filed for this visit.   Subjective Assessment - 07/29/20 1247    Subjective  Patient was in ED recently with constipation    Patient is accompanied by: Family member    Pertinent History HTN, HBP, CKD2, CATARACT REMOVAL 2018, STENOSIS    Currently in Pain? Yes    Pain Score 6     Pain Location Foot    Pain Orientation Left    Pain Descriptors / Indicators Burning    Pain Type Chronic pain    Pain Onset More than a month ago    Pain Frequency Constant                        OT Treatments/Exercises (OP) - 07/29/20 0001      ADLs   Bathing Son indicates patient transfers well into shower, he  (son) is helping only with scooting back onto shower seat.      Functional Mobility Worked today on sustained standing with decreased UE support.  Stood for over 4 minutes while building tower with right hand.  Patient took frequent standing stretch breaks and used standing weight shifts to  prolong standing.  Patient showing continuous improvement with turning and reversing in walker as needed for bathroom transfers.                      OT Short Term Goals - 07/29/20 1435      OT SHORT TERM GOAL #1   Title Patient will complete a home exercise program for BUE with set up assistance    Baseline No current UE HEP    Time 4    Period Weeks    Status Achieved      OT SHORT TERM GOAL #2   Title Patient will dress his lower body with min assist    Baseline Mod assist    Time 4    Period Weeks    Status On-going      OT SHORT TERM GOAL #3   Title Patient will put toothpaste on toothbrush with supervision assist    Baseline min assist    Time 4    Period Weeks    Status Achieved      OT SHORT TERM GOAL #4   Title Patient will transfer to toilet with minimal assist  Baseline mod assist    Period Weeks    Status Achieved   Patient can stand, walk, turn, and back up to chair in clinic.  Has not completed in home environment            OT Long Term Goals - 07/29/20 1435      OT LONG TERM GOAL #1   Title Patient will complete updated HEP independently    Status On-going      OT LONG TERM GOAL #2   Title Patient will dress lower body with set up assistance and supervision    Time 8    Period Weeks    Status Deferred      OT LONG TERM GOAL #3   Title Patient will transfer to toilet with supervision    Time 8    Period Weeks    Status Deferred      OT LONG TERM GOAL #4   Title Patient will tansfer to tub bench with min assistance, then shower with modified independence    Time 8    Status On-going      OT LONG TERM GOAL #5   Title Patient will shave himself with modified independence    Time 8    Period Weeks    Status On-going                 Plan - 07/29/20 1434    Clinical Impression Statement Patient is progressing towards goals for functional mobility and UE strength/ functional use.  Anticipate discharge as planned next  visit.    OT Occupational Profile and History Detailed Assessment- Review of Records and additional review of physical, cognitive, psychosocial history related to current functional performance    Occupational performance deficits (Please refer to evaluation for details): ADL's;IADL's    Body Structure / Function / Physical Skills ADL;Coordination;Endurance;GMC;UE functional use;Sensation;Decreased knowledge of precautions;Balance;Body mechanics;Decreased knowledge of use of DME;Flexibility;IADL;Pain;Strength;FMC;Dexterity;Cardiopulmonary status limiting activity;Gait;Mobility;ROM    Rehab Potential Good    Clinical Decision Making Several treatment options, min-mod task modification necessary    Comorbidities Affecting Occupational Performance: May have comorbidities impacting occupational performance    Modification or Assistance to Complete Evaluation  Min-Moderate modification of tasks or assist with assess necessary to complete eval    OT Frequency 2x / week    OT Duration 8 weeks    OT Treatment/Interventions Self-care/ADL training;Therapeutic exercise;Patient/family education;Neuromuscular education;Aquatic Therapy;Fluidtherapy;Building services engineer;Therapeutic activities;Balance training;Manual Therapy;DME and/or AE instruction    Plan discharge visit - final check of goals and HEP    OT Home Exercise Plan coordination, therapy putty, dowel exercises for BUE    Consulted and Agree with Plan of Care Patient;Family member/caregiver    Family Member Consulted son [name]           Patient will benefit from skilled therapeutic intervention in order to improve the following deficits and impairments:   Body Structure / Function / Physical Skills: ADL, Coordination, Endurance, GMC, UE functional use, Sensation, Decreased knowledge of precautions, Balance, Body mechanics, Decreased knowledge of use of DME, Flexibility, IADL, Pain, Strength, FMC, Dexterity, Cardiopulmonary status limiting  activity, Gait, Mobility, ROM       Visit Diagnosis: Muscle weakness (generalized)  Other symptoms and signs involving the nervous system  Unsteadiness on feet  Abnormal posture  Other disturbances of skin sensation  Chronic right shoulder pain    Problem List There are no problems to display for this patient.   Collier Salina, OTR/L 07/29/2020, 2:36 PM  [facility] [address] [place], [place], [address] Phone: [phone]   Fax:  [phone]  Name: Josue Falconi MRN: [id number] Date of Birth: Nov 18, 1922

## 2020-07-31 ENCOUNTER — Other Ambulatory Visit: Payer: Self-pay

## 2020-07-31 ENCOUNTER — Encounter: Payer: Self-pay | Admitting: Occupational Therapy

## 2020-07-31 ENCOUNTER — Ambulatory Visit: Payer: No Typology Code available for payment source

## 2020-07-31 ENCOUNTER — Ambulatory Visit: Payer: No Typology Code available for payment source | Admitting: Occupational Therapy

## 2020-07-31 DIAGNOSIS — M25511 Pain in right shoulder: Secondary | ICD-10-CM

## 2020-07-31 DIAGNOSIS — R29818 Other symptoms and signs involving the nervous system: Secondary | ICD-10-CM

## 2020-07-31 DIAGNOSIS — M6281 Muscle weakness (generalized): Secondary | ICD-10-CM

## 2020-07-31 DIAGNOSIS — R208 Other disturbances of skin sensation: Secondary | ICD-10-CM

## 2020-07-31 DIAGNOSIS — R2681 Unsteadiness on feet: Secondary | ICD-10-CM

## 2020-07-31 DIAGNOSIS — R293 Abnormal posture: Secondary | ICD-10-CM

## 2020-07-31 NOTE — Therapy (Signed)
[facility] [address] [place], [place], [address] Phone: [phone]   Fax:  [phone]  Occupational Therapy Treatment  Patient Details  Name: Wayne Dudley MRN: [id number] Date of Birth: April 04, 1923 Referring Provider (OT): [name]   Encounter Date: 07/31/2020   OT End of Session - 07/31/20 1249    Visit Number 15    Number of Visits 15    Authorization Type [place] approved through 07/23/20- additional visits have been requested per [place] - Visit Number 15    Authorization - Number of Visits 15    OT Start Time 0630    OT Stop Time 1230    OT Time Calculation (min) 45 min    Activity Tolerance Patient tolerated treatment well    Behavior During Therapy [facility] for tasks assessed/performed           Past Medical History:  Diagnosis Date  . Constipation   . Hypertension     History reviewed. No pertinent surgical history.  There were no vitals filed for this visit.   Subjective Assessment - 07/31/20 1235    Subjective  Patient indicates that he feels proud of his improvement with standing and walking    Patient is accompanied by: Family member    Pertinent History HTN, HBP, CKD2, CATARACT REMOVAL 2018, STENOSIS    Currently in Pain? Yes    Pain Score 6     Pain Location Foot    Pain Orientation Left    Pain Descriptors / Indicators Burning    Pain Type Chronic pain    Pain Onset More than a month ago    Pain Frequency Constant    Aggravating Factors  sitting    Pain Relieving Factors tylenol                        OT Treatments/Exercises (OP) - 07/31/20 0001      ADLs   Functional Mobility Continued emphasis on stand tolerance and stan balance with decreased reliance on BUE for support.  Patient does require one arm in support for standing tasks.  Patient able to stand for 51/2 min today.  Short rest break then easily able to return to standing  for 3+ minutes without difficulty.  Worked on small weight shifts forward and backward, left and right during standing.  Followed with functional ambulation.  Patient now able to execute turns without any appreciable increased assistance.  Patient able to walk with greater degrees of hip, knee and trunk extension - and periodically lifting eyes as walking forward.      ADL Comments Reviewed remaining goals - patient has met goals and is agreeable to OT discharge                  OT Education - 07/31/20 1248    Education Details reviewed HEP    Person(s) Educated Patient;Child(ren)    Methods Explanation    Comprehension Verbalized understanding            OT Short Term Goals - 07/31/20 1151      OT SHORT TERM GOAL #1   Title Patient will complete a home exercise program for BUE with set up assistance    Baseline No current UE HEP    Time 4    Period Weeks    Status Achieved      OT SHORT TERM GOAL #2   Title  Patient will dress his lower body with min assist    Baseline Mod assist    Time 4    Period Weeks    Status Achieved      OT SHORT TERM GOAL #3   Title Patient will put toothpaste on toothbrush with supervision assist    Baseline min assist    Time 4    Period Weeks    Status Achieved      OT SHORT TERM GOAL #4   Title Patient will transfer to toilet with minimal assist    Baseline mod assist    Period Weeks    Status Achieved   Patient can stand, walk, turn, and back up to chair in clinic.  Has not completed in home environment            OT Long Term Goals - 07/31/20 1152      OT [place] #1   Title Patient will complete updated HEP independently    Time 8    Period Weeks    Status Achieved      OT LONG TERM GOAL #2   Title Patient will dress lower body with set up assistance and supervision    Time 8    Period Weeks    Status Deferred      OT LONG TERM GOAL #3   Title Patient will transfer to toilet with supervision    Time 8     Period Weeks    Status Deferred      OT LONG TERM GOAL #4   Title Patient will tansfer to tub bench with min assistance, then shower with modified independence    Time 8    Status Achieved      OT LONG TERM GOAL #5   Title Patient will shave himself with modified independence    Time 8    Period Weeks    Status Achieved                 Plan - 07/31/20 1250    Clinical Impression Statement Patient is progressing towards goals for functional mobility and UE strength/ functional use.  Anticipate discharge as planned next visit.    OT Occupational Profile and History Detailed Assessment- Review of Records and additional review of physical, cognitive, psychosocial history related to current functional performance    Occupational performance deficits (Please refer to evaluation for details): ADL's;IADL's    Body Structure / Function / Physical Skills ADL;Coordination;Endurance;GMC;UE functional use;Sensation;Decreased knowledge of precautions;Balance;Body mechanics;Decreased knowledge of use of DME;Flexibility;IADL;Pain;Strength;FMC;Dexterity;Cardiopulmonary status limiting activity;Gait;Mobility;ROM    Rehab Potential Good    Clinical Decision Making Several treatment options, min-mod task modification necessary    Comorbidities Affecting Occupational Performance: May have comorbidities impacting occupational performance    Modification or Assistance to Complete Evaluation  Min-Moderate modification of tasks or assist with assess necessary to complete eval    OT Frequency 2x / week    OT Duration 8 weeks    OT Treatment/Interventions Self-care/ADL training;Therapeutic exercise;Patient/family education;Neuromuscular education;Aquatic Therapy;Fluidtherapy;Therapist, nutritional;Therapeutic activities;Balance training;Manual Therapy;DME and/or AE instruction    Plan discharge    OT Home Exercise Plan coordination, therapy putty, dowel exercises for BUE    Consulted and Agree with  Plan of Care Patient;Family member/caregiver    Family Member Consulted son Jeneen Rinks           Patient will benefit from skilled therapeutic intervention in order to improve the following deficits and impairments:   Body Structure / Function /  Physical Skills: ADL, Coordination, Endurance, GMC, UE functional use, Sensation, Decreased knowledge of precautions, Balance, Body mechanics, Decreased knowledge of use of DME, Flexibility, IADL, Pain, Strength, FMC, Dexterity, Cardiopulmonary status limiting activity, Gait, Mobility, ROM       Visit Diagnosis: Muscle weakness (generalized)  Other symptoms and signs involving the nervous system  Unsteadiness on feet  Abnormal posture  Other disturbances of skin sensation  Chronic right shoulder pain    Problem List There are no problems to display for this patient.   Mariah Milling, OTR/L 07/31/2020, 12:52 PM  [facility] [address] [place], [place], [address] Phone: [phone]   Fax:  [phone]  Name: Wayne Dudley MRN: [id number] Date of Birth: 1922/12/12

## 2020-08-05 ENCOUNTER — Encounter: Payer: Self-pay | Admitting: Physical Therapy

## 2020-08-05 ENCOUNTER — Ambulatory Visit: Payer: No Typology Code available for payment source | Attending: Psychiatry | Admitting: Physical Therapy

## 2020-08-05 ENCOUNTER — Other Ambulatory Visit: Payer: Self-pay

## 2020-08-05 DIAGNOSIS — R2689 Other abnormalities of gait and mobility: Secondary | ICD-10-CM | POA: Diagnosis present

## 2020-08-05 DIAGNOSIS — R2681 Unsteadiness on feet: Secondary | ICD-10-CM | POA: Insufficient documentation

## 2020-08-05 DIAGNOSIS — R293 Abnormal posture: Secondary | ICD-10-CM | POA: Insufficient documentation

## 2020-08-05 DIAGNOSIS — R29818 Other symptoms and signs involving the nervous system: Secondary | ICD-10-CM | POA: Diagnosis present

## 2020-08-05 DIAGNOSIS — M6281 Muscle weakness (generalized): Secondary | ICD-10-CM | POA: Diagnosis present

## 2020-08-05 NOTE — Therapy (Signed)
[facility] [address] [place], [place], [address] Phone: [phone]   Fax:  [phone]  Physical Therapy Treatment/Re-Cert  Patient Details  Name: Wayne Dudley MRN: [id number] Date of Birth: December 22, 1922 Referring Provider (PT): [name], MD   Encounter Date: 08/05/2020   PT End of Session - 08/05/20 1414    Visit Number 31    Number of Visits 45    Date for PT Re-Evaluation 46/50/35   90 day cert, 60 day POC   Authorization Type VA- 15 visits approved, expires 12/03/2020    Authorization - Visit Number 1    Authorization - Number of Visits 15    PT Start Time 1101    PT Stop Time 1141    PT Time Calculation (min) 40 min    Equipment Utilized During Treatment Gait belt    Activity Tolerance Patient tolerated treatment well   seated rest breaks taken as needed throughout   Behavior During Therapy WFL for tasks assessed/performed           Past Medical History:  Diagnosis Date  . Constipation   . Hypertension     History reviewed. No pertinent surgical history.  There were no vitals filed for this visit.   Subjective Assessment - 08/05/20 1104    Subjective Been standing at home at the countertop for 3-4 minutes. Met all of his goals with OT. Legs are feeling less heavy. Got the toe cap added to his R shoe.    Pertinent History footpain 2/2 to frostbite injury, CKD, hard of hearing.    How long can you stand comfortably? can stand for about 1-2 minutes before having pain (in the feet and left ankle)    Diagnostic tests MRI of lumbar spine 11/10/19: multilevel degenerative changes, severe spinal canal stenosis at L3-L4 and L5-S1    Patient Stated Goals wants to get back to where he can do for himself.    Currently in Pain? Yes    Pain Score 6     Pain Location Foot    Pain Orientation Left    Pain Descriptors / Indicators Burning    Pain Type Chronic pain    Pain Onset More than a month ago     Aggravating Factors  sitting    Pain Relieving Factors tylenol              [facility] PT Assessment - 08/05/20 0001      Assessment   Medical Diagnosis Spinal Stenosis    Referring Provider (PT) [name], MD      Prior Function   Level of Independence Independent with basic ADLs;Independent with homemaking with wheelchair                         [facility] Adult PT Treatment/Exercise - 08/05/20 0001      Transfers   Sit to Stand 5: Supervision    Sit to Stand Details (indicate cue type and reason) from w/c and mat table with RW    Stand to Sit 5: Supervision    Stand to Sit Details to mat table/w/c - decr eccentric control at times when fatigued    Stand Pivot Transfers 4: Min guard    Stand Pivot Transfer Details (indicate cue type and reason) x2 reps, with RW to and from w/c <> mat table, initial cues for sequencing       Ambulation/Gait   Ambulation/Gait Yes    Ambulation/Gait  Assistance 4: Min assist    Ambulation/Gait Assistance Details w/c follow for safety. pt demonstrating more erect posture with gait, tactile and verbal cues for quad activation esp on LLE during stance phase and to try to look up/spot 10' ahead for more upright posture. pt with intermittent steps where LLE would be too ADDucted and pt would have a more narrow BOS     Ambulation Distance (Feet) 68 Feet   x1   Assistive device Rolling walker    Gait Pattern Step-through pattern;Decreased step length - right;Decreased step length - left;Right flexed knee in stance;Left flexed knee in stance;Poor foot clearance - right    Ambulation Surface Level;Indoor    Pre-Gait Activities standing at RW static standing: 4 minutes and 30 seconds, supervision with intermittent min guard towards end, cues to relax B shoulders in standing and for B quad activation      High Level Balance   High Level Balance Comments standing at countertop with single UE: reaching outside of BOS to grab cone x6 reps (both  directions) and then crossing midline and placing it on opposite side, min guard/min A for balance, tactile and verbal cues for weight shifting; total standing for 2 minutes 20 seconds.      Exercises   Exercises Other Exercises    Other Exercises  pt reporting incr tightness behind L knee after standing activities, therapist performed L hamstring stretch, with pt seated in w/c, had pt's LLE over therapist's knee and therapist performing ankle DF, pt reporting stretch felt good 3 x 30 seconds                    PT Short Term Goals - 08/05/20 1117      PT SHORT TERM GOAL #1   Title Pt will be able to perform squat pivot vs. stand pivot transfers with RW from w/c to mat table consistently with min A in order to decr caregiver burden. ALL STGS DUE 07/27/20    Baseline performing stand pivot with RW with min guard    Time 6   due to delay in scheduling   Period Weeks    Status Achieved    Target Date 07/27/20   due to delay in scheduling     PT SHORT TERM GOAL #2   Title Pt will perform sit <> stand with supervision with RW in order to improve functional LE strength.    Baseline supervision/min guard when more fatigued.    Time 6    Period Weeks    Status Partially Met      PT SHORT TERM GOAL #3   Title Pt will be able to stand for at least 4 minutes at RW with min guard in order to improve tolerance for ADLs.    Baseline 3 min 31 sec on 06/05/20 at [facility] C[facility], 4 min and 30 seconds at [place] with supervision on 08/05/20    Time 6    Period Weeks    Status Achieved      PT SHORT TERM GOAL #4   Title Pt will ambulate 53' with RW min assist for improved mobility in home with son's assist.    Baseline ambulated 72' with RW with min A on 08/05/20    Time 6    Period Weeks    Status Achieved          Revised/ongoing STGs for re-cert:   PT Short Term Goals - 08/05/20 1432      PT SHORT  TERM GOAL #1   Title Pt will be able to perform stand pivot transfers with supervision to and from  mat table with RW in order to decr caregiver burden. ALL STGS DUE 09/02/20    Baseline performing stand pivot with RW with min guard    Time 4    Period Weeks    Status Revised    Target Date 09/02/20      PT SHORT TERM GOAL #2   Title Pt will perform sit <> stand with supervision consistently with single UE support and with RW in order to improve functional LE strength.    Baseline supervision/min guard when more fatigued.    Time 4    Period Weeks    Status Revised      PT SHORT TERM GOAL #3   Title Pt will be able to stand for at least 5 minutes at RW with supervision in order to improve tolerance for ADLs.    Baseline 3 min 31 sec on 06/05/20 at [facility] C[facility], 4 min and 30 seconds at RW with supervision/min guard on 08/05/20    Time 4    Period Weeks    Status Revised      PT SHORT TERM GOAL #4   Title Pt will ambulate 62' with RW min guard for improved mobility in home with son's assist.    Baseline ambulated 45' with RW with min A on 08/05/20    Time 4    Period Weeks    Status Revised             PT Long Term Goals - 06/15/20 2057      PT LONG TERM GOAL #1   Title Pt and pt's son/daughter will be independent in final HEP for LE strengthening/ROM. ALL LTGS DUE 08/24/20    Baseline ongoing    Time 10   due to delay in scheduling   Period Weeks    Status On-going    Target Date 08/24/20      PT LONG TERM GOAL #2   Title Pt will be able to perform squat pivot vs. stand pivot transfers with RW from w/c to mat table with min guard in order to decr caregiver burden.    Baseline supervision with squat pivot but min A with stand pivot transfers with RW from w/c to mat table on 06/05/20    Time 10    Period Weeks    Status New      PT LONG TERM GOAL #3   Title Pt will ambulate 20' with RW min assist for improved mobility in home with son's assist.    Baseline 22' with RW with mod A on 05/28/20    Time 10    Period Weeks    Status New      PT LONG TERM GOAL #4   Title Pt will  tolerate standing activities at RW for at least 6 minutes in order to improve tolerance for ADLs.    Baseline 2 min 36 sec at RW on 03/07/20, 2 minutes and 20 seconds at RW on 05/28/20, 3 min 31 sec on 06/05/20    Time 10    Period Weeks    Status On-going          Revised/on-going LTGs for re-cert:      PT Long Term Goals - 08/05/20 1434      PT LONG TERM GOAL #1   Title Pt and pt's son/daughter will be independent in final HEP  for LE strengthening/ROM. ALL LTGS 09/30/20    Baseline ongoing    Time 8    Period Weeks    Status On-going    Target Date 09/30/20      PT LONG TERM GOAL #2   Title Pt will be able to perform squat pivot vs. stand pivot transfers with RW from w/c to mat table with mod I in order to decr caregiver burden    Baseline min guard with RW    Time 8    Period Weeks    Status New      PT LONG TERM GOAL #3   Title Pt will ambulate 58' with RW and min guard for improved mobility in home with son's assist.    Baseline 65' with min A on 08/05/20    Time 8    Period Weeks    Status New      PT LONG TERM GOAL #4   Title Pt will tolerate standing activities at RW for at least 6 minutes in order to improve tolerance for ADLs.    Baseline 3 min 31 sec on 06/05/20    Time 8    Period Weeks    Status Revised               Plan - 08/05/20 1427    Clinical Impression Statement Pt returns to PT today after approx. 2 months (recently got VA authorization for 15 additonal visits). Due to pt not being seen for 2 months, began to assess STGs. Pt achieved 3 out of 4 STGs and partially met STG #2. Pt demonstrating improvements in functional mobility after working with OT for 15 visits - pt able to perform stand pivot with RW with min guard. Pt able to stand for 3 min and 31 seconds with more erect posture and min guard/supervision. Pt ambulated longest distance today of 19' with RW with min A from therapist. Will revise/update STGs and LTGs as appropriate and send new cert  to MD to reflect 60 day POC (current cert would run out next month). Will continue to progress towards LTGs.    Personal Factors and Comorbidities Age;Past/Current Experience    Examination-Activity Limitations Bed Mobility;Stand;Transfers;Locomotion Level;Hygiene/Grooming;Bathing;Toileting    Examination-Participation Restrictions Community Activity;Cleaning;Meal Prep    Stability/Clinical Decision Making Evolving/Moderate complexity    Rehab Potential Fair    PT Frequency 2x / week   1-2x   PT Duration --   7-8 weeks   PT Treatment/Interventions Gait training;Therapeutic exercise;Neuromuscular re-education;Therapeutic activities;ADLs/Self Care Home Management;Patient/family education;Passive range of motion    PT Next Visit Plan gait with RW. Will need w/c follow. core/trunk/postural strengthening. SciFit in w/c, standing in RW    Consulted and Agree with Plan of Care Patient;Family member/caregiver    Family Member Consulted pt's son [name]           Patient will benefit from skilled therapeutic intervention in order to improve the following deficits and impairments:  Decreased balance, Decreased activity tolerance, Decreased coordination, Decreased knowledge of use of DME, Decreased mobility, Decreased range of motion, Difficulty walking, Decreased strength, Impaired flexibility, Postural dysfunction, Impaired sensation, Pain  Visit Diagnosis: Muscle weakness (generalized)  Other symptoms and signs involving the nervous system  Unsteadiness on feet  Abnormal posture     Problem List There are no problems to display for this patient.   Drake Leach, PT, DPT  08/05/2020, 2:31 PM  [facility] [address] [place], [place], [address]  Phone: [phone]   Fax:  [phone]  Name: Wayne Dudley MRN: [id number] Date of Birth: 12/12/1922

## 2020-08-07 ENCOUNTER — Other Ambulatory Visit: Payer: Self-pay

## 2020-08-07 ENCOUNTER — Ambulatory Visit: Payer: No Typology Code available for payment source | Admitting: Physical Therapy

## 2020-08-07 DIAGNOSIS — M6281 Muscle weakness (generalized): Secondary | ICD-10-CM

## 2020-08-07 DIAGNOSIS — R293 Abnormal posture: Secondary | ICD-10-CM

## 2020-08-07 DIAGNOSIS — R2681 Unsteadiness on feet: Secondary | ICD-10-CM

## 2020-08-07 DIAGNOSIS — R29818 Other symptoms and signs involving the nervous system: Secondary | ICD-10-CM

## 2020-08-07 NOTE — Therapy (Signed)
[facility] [address] [place], [place], [address] Phone: [phone]   Fax:  [phone]  Physical Therapy Treatment  Patient Details  Name: Wayne Dudley MRN: [id number] Date of Birth: [birth year] Referring Provider (PT): Carrington Clamp, MD   Encounter Date: 08/07/2020   PT End of Session - 08/07/20 1200    Visit Number 32    Number of Visits 45    Date for PT Re-Evaluation 11/03/20   90 day cert, 60 day POC   Authorization Type VA- 15 visits approved, expires 12/03/2020    Authorization - Visit Number 2    Authorization - Number of Visits 15    PT Start Time 1101    PT Stop Time 1142    PT Time Calculation (min) 41 min    Equipment Utilized During Treatment Gait belt    Activity Tolerance Patient tolerated treatment well   seated rest breaks taken as needed throughout   Behavior During Therapy WFL for tasks assessed/performed           Past Medical History:  Diagnosis Date  . Constipation   . Hypertension     No past surgical history on file.  There were no vitals filed for this visit.   Subjective Assessment - 08/07/20 1104    Subjective Legs feeling good from the stretches.    Pertinent History footpain 2/2 to frostbite injury, CKD, hard of hearing.    How long can you stand comfortably? can stand for about 1-2 minutes before having pain (in the feet and left ankle)    Diagnostic tests MRI of lumbar spine 11/10/19: multilevel degenerative changes, severe spinal canal stenosis at L3-L4 and L5-S1    Patient Stated Goals wants to get back to where he can do for himself.    Currently in Pain? Yes    Pain Score 6     Pain Location Foot    Pain Orientation Left    Pain Descriptors / Indicators Burning    Pain Type Chronic pain    Pain Onset More than a month ago                             [facility] Adult PT Treatment/Exercise - 08/07/20 1126      Transfers   Sit to Stand 5:  Supervision    Sit to Stand Details (indicate cue type and reason) from w/c and mat table    Stand to Sit 5: Supervision    Stand to Sit Details to w/c and mat table, cues for reaching back after gait    Stand Pivot Transfers 4: Min guard    Stand Pivot Transfer Details (indicate cue type and reason) x2 reps, from w/c <> mat table, cues for sequencing and wider BOS      Ambulation/Gait   Ambulation/Gait Yes    Ambulation/Gait Assistance 4: Min assist    Ambulation/Gait Assistance Details w/c follow for seated rest break between each bout, verbal and tactile cues for quad activation during stance and for more upright posure. pt needing cues at times for wider BOS due to pt with tendency to have too narrow of a BOS with LLE. took intermittent standing rest breaks during first bout of gait to shift weight    Ambulation Distance (Feet) 62 Feet   X1, 32 x 1   Assistive device Rolling walker    Gait Pattern Step-through pattern;Decreased step length - right;Decreased step  length - left;Right flexed knee in stance;Left flexed knee in stance;Poor foot clearance - right    Ambulation Surface Level;Indoor      Knee/Hip Exercises: Supine   Bridges Strengthening;AROM;AAROM;5 reps    Bridges Limitations verbal and tactile cues for proper technique to use BLE, with assist from therapist at hips and through thighs, pt with limited range (prior could not lift against gravity)    Straight Leg Raises Strengthening;AAROM;AROM;2 sets;5 reps;Both    Straight Leg Raises Limitations verbal and tactile cues to perform a quad set first, able to lift in a minimal range, rest breaks taken in between each set of 5    Other Supine Knee/Hip Exercises isometric hip ABD with gait belt around thighs - cues for technque and holding for 3 seconds    Other Supine Knee/Hip Exercises attempted bent knee fall outs with LLE, incr difficulty sequencing movement and bringing LLE into ABD against gravity                     PT Short Term Goals - 08/05/20 1432      PT SHORT TERM GOAL #1   Title Pt will be able to perform stand pivot transfers with supervision to and from mat table with RW in order to decr caregiver burden. ALL STGS DUE 09/02/20    Baseline performing stand pivot with RW with min guard    Time 4    Period Weeks    Status Revised    Target Date 09/02/20      PT SHORT TERM GOAL #2   Title Pt will perform sit <> stand with supervision consistently with single UE support and with RW in order to improve functional LE strength.    Baseline supervision/min guard when more fatigued.    Time 4    Period Weeks    Status Revised      PT SHORT TERM GOAL #3   Title Pt will be able to stand for at least 5 minutes at RW with supervision in order to improve tolerance for ADLs.    Baseline 3 min 31 sec on 06/05/20 at [facility] C[facility], 4 min and 30 seconds at RW with supervision/min guard on 08/05/20    Time 4    Period Weeks    Status Revised      PT SHORT TERM GOAL #4   Title Pt will ambulate 58' with RW min guard for improved mobility in home with son's assist.    Baseline ambulated 73' with RW with min A on 08/05/20    Time 4    Period Weeks    Status Revised             PT Long Term Goals - 08/05/20 1434      PT LONG TERM GOAL #1   Title Pt and pt's son/daughter will be independent in final HEP for LE strengthening/ROM. ALL LTGS 09/30/20    Baseline ongoing    Time 8    Period Weeks    Status On-going    Target Date 09/30/20      PT LONG TERM GOAL #2   Title Pt will be able to perform squat pivot vs. stand pivot transfers with RW from w/c to mat table with mod I in order to decr caregiver burden    Baseline min guard with RW    Time 8    Period Weeks    Status New      PT LONG TERM GOAL #3  Title Pt will ambulate 47' with RW and min guard for improved mobility in home with son's assist.    Baseline 32' with min A on 08/05/20    Time 8    Period Weeks    Status New      PT LONG TERM GOAL #4    Title Pt will tolerate standing activities at RW for at least 6 minutes in order to improve tolerance for ADLs.    Baseline 3 min 31 sec on 06/05/20    Time 8    Period Weeks    Status Revised                 Plan - 08/07/20 1202    Clinical Impression Statement Focus of today's skilled session was BLE strengthening and gait training. Since last bout of therapy, pt showing improvements in BLE strength, able to perform active straight leg raises (with minimal range) on both legs without assistance. Pt is significantly improving in his gait distances, able to ambulate a total of 99' today (in 2 separate bouts) with min A. Will continue to progress towards LTGs.    Personal Factors and Comorbidities Age;Past/Current Experience    Examination-Activity Limitations Bed Mobility;Stand;Transfers;Locomotion Level;Hygiene/Grooming;Bathing;Toileting    Examination-Participation Restrictions Community Activity;Cleaning;Meal Prep    Stability/Clinical Decision Making Evolving/Moderate complexity    Rehab Potential Fair    PT Frequency 2x / week   1-2x   PT Duration --   7-8 weeks   PT Treatment/Interventions Gait training;Therapeutic exercise;Neuromuscular re-education;Therapeutic activities;ADLs/Self Care Home Management;Patient/family education;Passive range of motion    PT Next Visit Plan gait with RW. Will need w/c follow. core/trunk/postural strengthening. SciFit in w/c, standing in RW. isometric hip ABD.    Consulted and Agree with Plan of Care Patient;Family member/caregiver    Family Member Consulted pt's son [name]           Patient will benefit from skilled therapeutic intervention in order to improve the following deficits and impairments:  Decreased balance, Decreased activity tolerance, Decreased coordination, Decreased knowledge of use of DME, Decreased mobility, Decreased range of motion, Difficulty walking, Decreased strength, Impaired flexibility, Postural dysfunction, Impaired  sensation, Pain  Visit Diagnosis: Muscle weakness (generalized)  Other symptoms and signs involving the nervous system  Unsteadiness on feet  Abnormal posture     Problem List There are no problems to display for this patient.   Drake Leach, PT, DPT  08/07/2020, 12:09 PM  [facility] [address] [place], [place], [address] Phone: [phone]   Fax:  [phone]  Name: Jaydien Panepinto MRN: [id number] Date of Birth: February 02, 1923

## 2020-08-12 ENCOUNTER — Ambulatory Visit: Payer: No Typology Code available for payment source | Admitting: Physical Therapy

## 2020-08-12 ENCOUNTER — Other Ambulatory Visit: Payer: Self-pay

## 2020-08-12 DIAGNOSIS — M6281 Muscle weakness (generalized): Secondary | ICD-10-CM

## 2020-08-12 DIAGNOSIS — R293 Abnormal posture: Secondary | ICD-10-CM

## 2020-08-12 DIAGNOSIS — R29818 Other symptoms and signs involving the nervous system: Secondary | ICD-10-CM

## 2020-08-12 DIAGNOSIS — R2681 Unsteadiness on feet: Secondary | ICD-10-CM

## 2020-08-12 NOTE — Therapy (Signed)
[facility] [address] [place], [place], [address] Phone: [phone]   Fax:  [phone]  Physical Therapy Treatment  Patient Details  Name: Wayne Dudley MRN: [id number] Date of Birth: 11-12-1922 Referring Provider (PT): Carrington Clamp, MD   Encounter Date: 08/12/2020   PT End of Session - 08/12/20 1414    Visit Number 33    Number of Visits 45    Date for PT Re-Evaluation 11/03/20   90 day cert, 60 day POC   Authorization Type VA- 15 visits approved, expires 12/03/2020    Authorization - Visit Number 3    Authorization - Number of Visits 15    PT Start Time 1100    PT Stop Time 1141    PT Time Calculation (min) 41 min    Equipment Utilized During Treatment Gait belt    Activity Tolerance Patient tolerated treatment well   seated rest breaks taken as needed throughout   Behavior During Therapy WFL for tasks assessed/performed           Past Medical History:  Diagnosis Date  . Constipation   . Hypertension     No past surgical history on file.  There were no vitals filed for this visit.   Subjective Assessment - 08/12/20 1103    Subjective Is proud of himself after last visit. Been stretching out the back of the legs and they have felt good.    Pertinent History footpain 2/2 to frostbite injury, CKD, hard of hearing.    How long can you stand comfortably? can stand for about 1-2 minutes before having pain (in the feet and left ankle)    Diagnostic tests MRI of lumbar spine 11/10/19: multilevel degenerative changes, severe spinal canal stenosis at L3-L4 and L5-S1    Patient Stated Goals wants to get back to where he can do for himself.    Currently in Pain? Yes    Pain Score 6     Pain Location Foot    Pain Orientation Left    Pain Descriptors / Indicators Burning    Pain Type Chronic pain    Pain Onset More than a month ago                             [facility] Adult PT  Treatment/Exercise - 08/12/20 0001      Transfers   Sit to Stand 5: Supervision;4: Min guard    Sit to Stand Details (indicate cue type and reason) from w/c and mat table, pt continues with BLE bracing against mat table    Stand to Sit 5: Supervision    Stand to Sit Details to w/c and mat table    Stand Pivot Transfers 4: Min guard    Stand Pivot Transfer Details (indicate cue type and reason) from w/c to mat table with RW    Comments x7 reps at beginning of session from mat table with single UE pushing off from mat and other UE on RW, cues for nose over toes and scooting towards edge      Ambulation/Gait   Ambulation/Gait Yes    Ambulation/Gait Assistance 4: Min guard;4: Min assist    Ambulation/Gait Assistance Details w/c follow for safety. initially therapist providing min guard when pt is not as fatigued. verbal and tactile cues throughout for posture and quad activation during stance, with pt with improved swing phase when pt performing with taller posture  Ambulation Distance (Feet) 44 Feet   x1, 38 x 1, 55 x 1    Assistive device Rolling walker    Gait Pattern Step-through pattern;Decreased step length - right;Decreased step length - left;Right flexed knee in stance;Left flexed knee in stance;Poor foot clearance - right    Ambulation Surface Level;Indoor      Knee/Hip Exercises: Seated   Long Arc Quad Strengthening;AROM;Both;2 sets;10 reps    Long Arc Quad Limitations 2# on RLE, 1# on LLE - visual cue on how high to kick    Heel Slides Strengthening;AAROM;Both;1 set;10 reps    Heel Slides Limitations with use of towel on floor to help decr friction, with therapist providing AAROM with LLE                    PT Short Term Goals - 08/05/20 1432      PT SHORT TERM GOAL #1   Title Pt will be able to perform stand pivot transfers with supervision to and from mat table with RW in order to decr caregiver burden. ALL STGS DUE 09/02/20    Baseline performing stand pivot with  RW with min guard    Time 4    Period Weeks    Status Revised    Target Date 09/02/20      PT SHORT TERM GOAL #2   Title Pt will perform sit <> stand with supervision consistently with single UE support and with RW in order to improve functional LE strength.    Baseline supervision/min guard when more fatigued.    Time 4    Period Weeks    Status Revised      PT SHORT TERM GOAL #3   Title Pt will be able to stand for at least 5 minutes at RW with supervision in order to improve tolerance for ADLs.    Baseline 3 min 31 sec on 06/05/20 at RW CGA, 4 min and 30 seconds at RW with supervision/min guard on 08/05/20    Time 4    Period Weeks    Status Revised      PT SHORT TERM GOAL #4   Title Pt will ambulate 34' with RW min guard for improved mobility in home with son's assist.    Baseline ambulated 22' with RW with min A on 08/05/20    Time 4    Period Weeks    Status Revised             PT Long Term Goals - 08/05/20 1434      PT LONG TERM GOAL #1   Title Pt and pt's son/daughter will be independent in final HEP for LE strengthening/ROM. ALL LTGS 09/30/20    Baseline ongoing    Time 8    Period Weeks    Status On-going    Target Date 09/30/20      PT LONG TERM GOAL #2   Title Pt will be able to perform squat pivot vs. stand pivot transfers with RW from w/c to mat table with mod I in order to decr caregiver burden    Baseline min guard with RW    Time 8    Period Weeks    Status New      PT LONG TERM GOAL #3   Title Pt will ambulate 16' with RW and min guard for improved mobility in home with son's assist.    Baseline 42' with min A on 08/05/20    Time 8  Period Weeks    Status New      PT LONG TERM GOAL #4   Title Pt will tolerate standing activities at RW for at least 6 minutes in order to improve tolerance for ADLs.    Baseline 3 min 31 sec on 06/05/20    Time 8    Period Weeks    Status Revised                 Plan - 08/12/20 1415    Clinical  Impression Statement Pt able to progress total gait distance walked in therapy - over 3 separate bouts, pt able to ambulate 137'. Decr assist needed at times for gait (min guard) when pt is less fatigued. Will continue to progress towards LTGs.    Personal Factors and Comorbidities Age;Past/Current Experience    Examination-Activity Limitations Bed Mobility;Stand;Transfers;Locomotion Level;Hygiene/Grooming;Bathing;Toileting    Examination-Participation Restrictions Community Activity;Cleaning;Meal Prep    Stability/Clinical Decision Making Evolving/Moderate complexity    Rehab Potential Fair    PT Frequency 2x / week   1-2x   PT Duration --   7-8 weeks   PT Treatment/Interventions Gait training;Therapeutic exercise;Neuromuscular re-education;Therapeutic activities;ADLs/Self Care Home Management;Patient/family education;Passive range of motion    PT Next Visit Plan gait with RW. Will need w/c follow, try smaller distance gait with pt's son? core/trunk/postural strengthening. SciFit in w/c, standing in RW. isometric hip ABD.    Consulted and Agree with Plan of Care Patient;Family member/caregiver    Family Member Consulted pt's son [name]           Patient will benefit from skilled therapeutic intervention in order to improve the following deficits and impairments:  Decreased balance, Decreased activity tolerance, Decreased coordination, Decreased knowledge of use of DME, Decreased mobility, Decreased range of motion, Difficulty walking, Decreased strength, Impaired flexibility, Postural dysfunction, Impaired sensation, Pain  Visit Diagnosis: Muscle weakness (generalized)  Other symptoms and signs involving the nervous system  Unsteadiness on feet  Abnormal posture     Problem List There are no problems to display for this patient.   Drake Leach, PT, DPT  08/12/2020, 2:18 PM  [facility] [address] [place], [place], [address] Phone: [phone]   Fax:  [phone]  Name: Zorion Nims MRN: [id number] Date of Birth: 11-Jun-1923

## 2020-08-14 ENCOUNTER — Ambulatory Visit: Payer: No Typology Code available for payment source | Admitting: Physical Therapy

## 2020-08-14 ENCOUNTER — Other Ambulatory Visit: Payer: Self-pay

## 2020-08-14 VITALS — BP 150/68 | HR 60

## 2020-08-14 DIAGNOSIS — M6281 Muscle weakness (generalized): Secondary | ICD-10-CM | POA: Diagnosis not present

## 2020-08-14 DIAGNOSIS — R29818 Other symptoms and signs involving the nervous system: Secondary | ICD-10-CM

## 2020-08-14 DIAGNOSIS — R293 Abnormal posture: Secondary | ICD-10-CM

## 2020-08-14 DIAGNOSIS — R2681 Unsteadiness on feet: Secondary | ICD-10-CM

## 2020-08-14 NOTE — Therapy (Signed)
[facility] [address] [place], [place], [address] Phone: [phone]   Fax:  [phone]  Physical Therapy Treatment  Patient Details  Name: Wayne Dudley MRN: [id number] Date of Birth: Apr 16, 1923 Referring Provider (PT): Carrington Clamp, MD   Encounter Date: 08/14/2020   PT End of Session - 08/14/20 1152    Visit Number 34    Number of Visits 45    Date for PT Re-Evaluation 11/03/20   90 day cert, 60 day POC   Authorization Type VA- 15 visits approved, expires 12/03/2020    Authorization - Visit Number 4    Authorization - Number of Visits 15    PT Start Time 1102    PT Stop Time 1144    PT Time Calculation (min) 42 min    Equipment Utilized During Treatment Gait belt    Activity Tolerance Patient tolerated treatment well;Patient limited by fatigue    Behavior During Therapy WFL for tasks assessed/performed           Past Medical History:  Diagnosis Date  . Constipation   . Hypertension     No past surgical history on file.  Vitals:   08/14/20 1130 08/14/20 1134  BP: (!) 178/49 (!) 150/68  Pulse: 60      Subjective Assessment - 08/14/20 1104    Subjective Left foot is hurting him a little more today.    Pertinent History footpain 2/2 to frostbite injury, CKD, hard of hearing.    How long can you stand comfortably? can stand for about 1-2 minutes before having pain (in the feet and left ankle)    Diagnostic tests MRI of lumbar spine 11/10/19: multilevel degenerative changes, severe spinal canal stenosis at L3-L4 and L5-S1    Patient Stated Goals wants to get back to where he can do for himself.    Currently in Pain? Yes    Pain Score 6     Pain Location Foot    Pain Orientation Left    Pain Descriptors / Indicators Burning    Pain Type Chronic pain    Pain Onset More than a month ago                             [facility] Adult PT Treatment/Exercise - 08/14/20 1109       Transfers   Sit to Stand 5: Supervision;4: Min guard    Sit to Stand Details (indicate cue type and reason) from w/c    Stand to Sit 5: Supervision      Ambulation/Gait   Ambulation/Gait Yes    Ambulation/Gait Assistance 4: Min assist    Ambulation/Gait Assistance Details w/c follow, pt more fatigued today and having incr forward flexed posture and B knee flexion, needing verbal and tactile cues for tall posture and quad activation in order for improved foot clearance    Ambulation Distance (Feet) 18 Feet   x1, 22 x 1   Assistive device Rolling walker    Gait Pattern Step-through pattern;Decreased step length - right;Decreased step length - left;Right flexed knee in stance;Left flexed knee in stance;Poor foot clearance - right    Ambulation Surface Level;Indoor    Pre-Gait Activities standing at countertop with BUE support: lateral weight shifting with cues for quad activation when shifting weight x6 reps B, min guard for safety with w/c posteriorly      Exercises   Exercises Other Exercises    Other Exercises  seated in w/c: with gait belt around distal thighs isometric hip abd with 3 second holds, x10 reps LAQs with LLE with visual cue from therapist on how high for pt to kick leg up      Knee/Hip Exercises: Aerobic   Other Aerobic SciFit with BLE and BUE for ROM, aerobic warmup, and strengthening x6 minutes at gear 3.0, HR = 72 after                    PT Short Term Goals - 08/05/20 1432      PT SHORT TERM GOAL #1   Title Pt will be able to perform stand pivot transfers with supervision to and from mat table with RW in order to decr caregiver burden. ALL STGS DUE 09/02/20    Baseline performing stand pivot with RW with min guard    Time 4    Period Weeks    Status Revised    Target Date 09/02/20      PT SHORT TERM GOAL #2   Title Pt will perform sit <> stand with supervision consistently with single UE support and with RW in order to improve functional LE strength.     Baseline supervision/min guard when more fatigued.    Time 4    Period Weeks    Status Revised      PT SHORT TERM GOAL #3   Title Pt will be able to stand for at least 5 minutes at RW with supervision in order to improve tolerance for ADLs.    Baseline 3 min 31 sec on 06/05/20 at RW CGA, 4 min and 30 seconds at RW with supervision/min guard on 08/05/20    Time 4    Period Weeks    Status Revised      PT SHORT TERM GOAL #4   Title Pt will ambulate 63' with RW min guard for improved mobility in home with son's assist.    Baseline ambulated 16' with RW with min A on 08/05/20    Time 4    Period Weeks    Status Revised             PT Long Term Goals - 08/05/20 1434      PT LONG TERM GOAL #1   Title Pt and pt's son/daughter will be independent in final HEP for LE strengthening/ROM. ALL LTGS 09/30/20    Baseline ongoing    Time 8    Period Weeks    Status On-going    Target Date 09/30/20      PT LONG TERM GOAL #2   Title Pt will be able to perform squat pivot vs. stand pivot transfers with RW from w/c to mat table with mod I in order to decr caregiver burden    Baseline min guard with RW    Time 8    Period Weeks    Status New      PT LONG TERM GOAL #3   Title Pt will ambulate 72' with RW and min guard for improved mobility in home with son's assist.    Baseline 76' with min A on 08/05/20    Time 8    Period Weeks    Status New      PT LONG TERM GOAL #4   Title Pt will tolerate standing activities at RW for at least 6 minutes in order to improve tolerance for ADLs.    Baseline 3 min 31 sec on 06/05/20    Time 8  Period Weeks    Status Revised                 Plan - 08/14/20 1154    Clinical Impression Statement Pt reporting LLE felt more fatigued today (possibly due to incr gait distance/activity from last session)- unable to progress walking distance and pt having more forward flexed posture and B knee flexion with gait, needing min A. BP assessed and WFL. Will  continue to progress towards LTGs.    Personal Factors and Comorbidities Age;Past/Current Experience    Examination-Activity Limitations Bed Mobility;Stand;Transfers;Locomotion Level;Hygiene/Grooming;Bathing;Toileting    Examination-Participation Restrictions Community Activity;Cleaning;Meal Prep    Stability/Clinical Decision Making Evolving/Moderate complexity    Rehab Potential Fair    PT Frequency 2x / week   1-2x   PT Duration --   7-8 weeks   PT Treatment/Interventions Gait training;Therapeutic exercise;Neuromuscular re-education;Therapeutic activities;ADLs/Self Care Home Management;Patient/family education;Passive range of motion    PT Next Visit Plan gait with RW. Will need w/c follow, try smaller distance gait with pt's son when appropriate with a chair set up approx. 10' way? core/trunk/postural strengthening. SciFit in w/c, standing in RW. isometric hip ABD.    Consulted and Agree with Plan of Care Patient;Family member/caregiver    Family Member Consulted pt's son [name]           Patient will benefit from skilled therapeutic intervention in order to improve the following deficits and impairments:  Decreased balance, Decreased activity tolerance, Decreased coordination, Decreased knowledge of use of DME, Decreased mobility, Decreased range of motion, Difficulty walking, Decreased strength, Impaired flexibility, Postural dysfunction, Impaired sensation, Pain  Visit Diagnosis: Muscle weakness (generalized)  Other symptoms and signs involving the nervous system  Unsteadiness on feet  Abnormal posture     Problem List There are no problems to display for this patient.   Drake Leach , PT, DPT  08/14/2020, 12:01 PM  [facility] [address] [place], [place], [address] Phone: [phone]   Fax:  [phone]  Name: Clint Biello MRN: [id number] Date of Birth: 28-Sep-1923

## 2020-08-19 ENCOUNTER — Ambulatory Visit: Payer: No Typology Code available for payment source

## 2020-08-19 ENCOUNTER — Other Ambulatory Visit: Payer: Self-pay

## 2020-08-19 DIAGNOSIS — R2681 Unsteadiness on feet: Secondary | ICD-10-CM

## 2020-08-19 DIAGNOSIS — R2689 Other abnormalities of gait and mobility: Secondary | ICD-10-CM

## 2020-08-19 DIAGNOSIS — M6281 Muscle weakness (generalized): Secondary | ICD-10-CM | POA: Diagnosis not present

## 2020-08-19 NOTE — Therapy (Signed)
[facility] [address] [place], [place], [address] Phone: [phone]   Fax:  [phone]  Physical Therapy Treatment  Patient Details  Name: Wayne Dudley MRN: [id number] Date of Birth: 1923/07/13 Referring Provider (PT): Carrington Clamp, MD   Encounter Date: 08/19/2020   PT End of Session - 08/19/20 1450    Visit Number 35    Number of Visits 45    Date for PT Re-Evaluation 11/03/20   90 day cert, 60 day POC   Authorization Type VA- 15 visits approved, expires 12/03/2020    Authorization - Visit Number 5    Authorization - Number of Visits 15    PT Start Time 1448    PT Stop Time 1527    PT Time Calculation (min) 39 min    Equipment Utilized During Treatment Gait belt    Activity Tolerance Patient tolerated treatment well;Patient limited by fatigue    Behavior During Therapy [facility] for tasks assessed/performed           Past Medical History:  Diagnosis Date  . Constipation   . Hypertension     No past surgical history on file.  There were no vitals filed for this visit.   Subjective Assessment - 08/19/20 1451    Subjective Pt denies any new concerns. Reports standing and walking more feels good. Still just doing the standing at home.    Pertinent History footpain 2/2 to frostbite injury, CKD, hard of hearing.    How long can you stand comfortably? can stand for about 1-2 minutes before having pain (in the feet and left ankle)    Diagnostic tests MRI of lumbar spine 11/10/19: multilevel degenerative changes, severe spinal canal stenosis at L3-L4 and L5-S1    Patient Stated Goals wants to get back to where he can do for himself.    Currently in Pain? Yes    Pain Score 6     Pain Location Foot    Pain Orientation Left    Pain Descriptors / Indicators Burning    Pain Onset More than a month ago    Pain Frequency Constant                             [facility] Adult PT Treatment/Exercise -  08/19/20 1452      Transfers   Transfers Sit to Stand;Stand to Sit    Sit to Stand 4: Min guard    Sit to Stand Details (indicate cue type and reason) Pt peformed sit to stand x 3 from w/c.    Stand to Sit 4: Min guard      Ambulation/Gait   Ambulation/Gait Yes    Ambulation/Gait Assistance 4: Min assist    Ambulation/Gait Assistance Details with w/c follow. Verbal cues to push through left leg to try to straighten more to help with right foot clearance. Pt does catch right toes sliding through at times using toe cap.     Ambulation Distance (Feet) 30 Feet   39', 32', 20'   Assistive device Rolling walker    Gait Pattern Step-through pattern;Decreased dorsiflexion - right;Decreased dorsiflexion - left;Decreased stance time - left    Ambulation Surface Level;Indoor    Pre-Gait Activities Standing at walker: tapping 2" step x 5 with LLE CGA with verbal cues to stand tall.    Gait Comments Pt ambulated with son assisting 10' x 2 to chair turned 90 degrees to mimic how they  could safely perform short distances at home.                   PT Education - 08/19/20 1708    Education Details Discussed working on walking short distances 10-15' at home with son to chair.    Person(s) Educated Patient;Child(ren)    Methods Explanation;Demonstration    Comprehension Verbalized understanding;Returned demonstration            PT Short Term Goals - 08/05/20 1432      PT SHORT TERM GOAL #1   Title Pt will be able to perform stand pivot transfers with supervision to and from mat table with RW in order to decr caregiver burden. ALL STGS DUE 09/02/20    Baseline performing stand pivot with RW with min guard    Time 4    Period Weeks    Status Revised    Target Date 09/02/20      PT SHORT TERM GOAL #2   Title Pt will perform sit <> stand with supervision consistently with single UE support and with RW in order to improve functional LE strength.    Baseline supervision/min guard when more  fatigued.    Time 4    Period Weeks    Status Revised      PT SHORT TERM GOAL #3   Title Pt will be able to stand for at least 5 minutes at RW with supervision in order to improve tolerance for ADLs.    Baseline 3 min 31 sec on 06/05/20 at RW CGA, 4 min and 30 seconds at RW with supervision/min guard on 08/05/20    Time 4    Period Weeks    Status Revised      PT SHORT TERM GOAL #4   Title Pt will ambulate 66' with RW min guard for improved mobility in home with son's assist.    Baseline ambulated 62' with RW with min A on 08/05/20    Time 4    Period Weeks    Status Revised             PT Long Term Goals - 08/05/20 1434      PT LONG TERM GOAL #1   Title Pt and pt's son/daughter will be independent in final HEP for LE strengthening/ROM. ALL LTGS 09/30/20    Baseline ongoing    Time 8    Period Weeks    Status On-going    Target Date 09/30/20      PT LONG TERM GOAL #2   Title Pt will be able to perform squat pivot vs. stand pivot transfers with RW from w/c to mat table with mod I in order to decr caregiver burden    Baseline min guard with RW    Time 8    Period Weeks    Status New      PT LONG TERM GOAL #3   Title Pt will ambulate 65' with RW and min guard for improved mobility in home with son's assist.    Baseline 15' with min A on 08/05/20    Time 8    Period Weeks    Status New      PT LONG TERM GOAL #4   Title Pt will tolerate standing activities at RW for at least 6 minutes in order to improve tolerance for ADLs.    Baseline 3 min 31 sec on 06/05/20    Time 8    Period Weeks    Status  Revised                 Plan - 08/19/20 1709    Clinical Impression Statement Pt continues to progress gait with more consistency with gait distances. Less knee flexion and stopping to rest stating that he is just tired. HR was 72 with gait. PT worked with son to design way for them to safely work on at home.    Personal Factors and Comorbidities Age;Past/Current  Experience    Examination-Activity Limitations Bed Mobility;Stand;Transfers;Locomotion Level;Hygiene/Grooming;Bathing;Toileting    Examination-Participation Restrictions Community Activity;Cleaning;Meal Prep    Stability/Clinical Decision Making Evolving/Moderate complexity    Rehab Potential Fair    PT Frequency 2x / week   1-2x   PT Duration --   7-8 weeks   PT Treatment/Interventions Gait training;Therapeutic exercise;Neuromuscular re-education;Therapeutic activities;ADLs/Self Care Home Management;Patient/family education;Passive range of motion    PT Next Visit Plan Did pt and son try walking at home? gait with RW. Will need w/c follow,  core/trunk/postural strengthening. SciFit in w/c, standing in RW. isometric hip ABD.    Consulted and Agree with Plan of Care Patient;Family member/caregiver    Family Member Consulted pt's son [name]           Patient will benefit from skilled therapeutic intervention in order to improve the following deficits and impairments:  Decreased balance, Decreased activity tolerance, Decreased coordination, Decreased knowledge of use of DME, Decreased mobility, Decreased range of motion, Difficulty walking, Decreased strength, Impaired flexibility, Postural dysfunction, Impaired sensation, Pain  Visit Diagnosis: Other abnormalities of gait and mobility  Muscle weakness (generalized)  Unsteadiness on feet     Problem List There are no problems to display for this patient.   Ronn Melena, PT, DPT, NCS 08/19/2020, 5:11 PM  [facility] [address] [place], [place], [address] Phone: [phone]   Fax:  [phone]  Name: Wayne Dudley MRN: [id number] Date of Birth: February 16, 1923

## 2020-08-21 ENCOUNTER — Other Ambulatory Visit: Payer: Self-pay

## 2020-08-21 ENCOUNTER — Ambulatory Visit: Payer: No Typology Code available for payment source

## 2020-08-21 DIAGNOSIS — M6281 Muscle weakness (generalized): Secondary | ICD-10-CM

## 2020-08-21 DIAGNOSIS — R2689 Other abnormalities of gait and mobility: Secondary | ICD-10-CM

## 2020-08-21 NOTE — Therapy (Signed)
[facility] [address] [place], [place], [address] Phone: [phone]   Fax:  [phone]  Physical Therapy Treatment  Patient Details  Name: Wayne Dudley MRN: [id number] Date of Birth: 05-10-1923 Referring Provider (PT): Carrington Clamp, MD   Encounter Date: 08/21/2020   PT End of Session - 08/21/20 1544    Visit Number 36    Number of Visits 45    Date for PT Re-Evaluation 11/03/20   90 day cert, 60 day POC   Authorization Type VA- 15 visits approved, expires 12/03/2020    Authorization - Visit Number 6    Authorization - Number of Visits 15    PT Start Time 1534    PT Stop Time 1615    PT Time Calculation (min) 41 min    Equipment Utilized During Treatment Gait belt    Activity Tolerance Patient tolerated treatment well;Patient limited by fatigue    Behavior During Therapy [facility] for tasks assessed/performed           Past Medical History:  Diagnosis Date  . Constipation   . Hypertension     History reviewed. No pertinent surgical history.  There were no vitals filed for this visit.   Subjective Assessment - 08/21/20 1546    Subjective Pt reports doing good. He is having some pain in left heel more so today. PT checked heel and no issues noted. Has been walking at home with son as instructed.    Pertinent History footpain 2/2 to frostbite injury, CKD, hard of hearing.    How long can you stand comfortably? can stand for about 1-2 minutes before having pain (in the feet and left ankle)    Diagnostic tests MRI of lumbar spine 11/10/19: multilevel degenerative changes, severe spinal canal stenosis at L3-L4 and L5-S1    Patient Stated Goals wants to get back to where he can do for himself.    Currently in Pain? Yes    Pain Score 6     Pain Location Foot    Pain Orientation Left    Pain Descriptors / Indicators Burning    Pain Onset More than a month ago                              [facility] Adult PT Treatment/Exercise - 08/21/20 1537      Transfers   Transfers Sit to Stand;Stand to [employer] Transfers    Sit to Stand 4: Min guard    Stand to Sit 4: Min guard    Stand Pivot Transfers 4: Min guard    Stand Pivot Transfer Details (indicate cue type and reason) w/c to SciFit with walker. Pt was cued to stay up tall    Comments Pt performed sit to stand x 5 at end of session from w/c with verbal cues to lean forward with rising and be sure to reach back to control descent.      Ambulation/Gait   Ambulation/Gait Yes    Ambulation/Gait Assistance 4: Min assist    Ambulation/Gait Assistance Details Pt was given verbal cues to stay up tall and tighten left quad to allow improved clearance. Pt had increased flexion at knees as went on especially on left. Reports legs feeling better after walking.    Ambulation Distance (Feet) 32 Feet   25' x 2   Assistive device Rolling walker    Gait Pattern Step-through pattern;Left flexed knee in stance  Ambulation Surface Level;Indoor      Exercises   Exercises Other Exercises    Other Exercises  Seated LAQ x 10 right with 3# weight then 5 x 2 left with 2# weight. Seated hip abd with light manual resistance x 10 and hip adduction x 10. Pt has decreased movement on left.      Knee/Hip Exercises: Aerobic   Other Aerobic SciFit x 8 min level 3 with BLE and BUE. Verbal cues to keep hips in neutral position.                    PT Short Term Goals - 08/05/20 1432      PT SHORT TERM GOAL #1   Title Pt will be able to perform stand pivot transfers with supervision to and from mat table with RW in order to decr caregiver burden. ALL STGS DUE 09/02/20    Baseline performing stand pivot with RW with min guard    Time 4    Period Weeks    Status Revised    Target Date 09/02/20      PT SHORT TERM GOAL #2   Title Pt will perform sit <> stand with supervision consistently with single UE support and with RW in order to improve  functional LE strength.    Baseline supervision/min guard when more fatigued.    Time 4    Period Weeks    Status Revised      PT SHORT TERM GOAL #3   Title Pt will be able to stand for at least 5 minutes at RW with supervision in order to improve tolerance for ADLs.    Baseline 3 min 31 sec on 06/05/20 at RW CGA, 4 min and 30 seconds at RW with supervision/min guard on 08/05/20    Time 4    Period Weeks    Status Revised      PT SHORT TERM GOAL #4   Title Pt will ambulate 11' with RW min guard for improved mobility in home with son's assist.    Baseline ambulated 105' with RW with min A on 08/05/20    Time 4    Period Weeks    Status Revised             PT Long Term Goals - 08/05/20 1434      PT LONG TERM GOAL #1   Title Pt and pt's son/daughter will be independent in final HEP for LE strengthening/ROM. ALL LTGS 09/30/20    Baseline ongoing    Time 8    Period Weeks    Status On-going    Target Date 09/30/20      PT LONG TERM GOAL #2   Title Pt will be able to perform squat pivot vs. stand pivot transfers with RW from w/c to mat table with mod I in order to decr caregiver burden    Baseline min guard with RW    Time 8    Period Weeks    Status New      PT LONG TERM GOAL #3   Title Pt will ambulate 73' with RW and min guard for improved mobility in home with son's assist.    Baseline 8' with min A on 08/05/20    Time 8    Period Weeks    Status New      PT LONG TERM GOAL #4   Title Pt will tolerate standing activities at RW for at least 6 minutes in order to  improve tolerance for ADLs.    Baseline 3 min 31 sec on 06/05/20    Time 8    Period Weeks    Status Revised                 Plan - 08/21/20 1719    Clinical Impression Statement Pt continues to report legs feeling better after activity and gait. Happy to be on his feet. He was able to continue 3 bouts of gait. Was more flexed at left knee especially as fatigued.    Personal Factors and Comorbidities  Age;Past/Current Experience    Examination-Activity Limitations Bed Mobility;Stand;Transfers;Locomotion Level;Hygiene/Grooming;Bathing;Toileting    Examination-Participation Restrictions Community Activity;Cleaning;Meal Prep    Stability/Clinical Decision Making Evolving/Moderate complexity    Rehab Potential Fair    PT Frequency 2x / week   1-2x   PT Duration --   7-8 weeks   PT Treatment/Interventions Gait training;Therapeutic exercise;Neuromuscular re-education;Therapeutic activities;ADLs/Self Care Home Management;Patient/family education;Passive range of motion    PT Next Visit Plan STG check due next week. gait with RW. Will need w/c follow,  core/trunk/postural strengthening. SciFit in w/c, standing in RW. isometric hip ABD.    Consulted and Agree with Plan of Care Patient;Family member/caregiver    Family Member Consulted pt's son [name]           Patient will benefit from skilled therapeutic intervention in order to improve the following deficits and impairments:  Decreased balance, Decreased activity tolerance, Decreased coordination, Decreased knowledge of use of DME, Decreased mobility, Decreased range of motion, Difficulty walking, Decreased strength, Impaired flexibility, Postural dysfunction, Impaired sensation, Pain  Visit Diagnosis: Other abnormalities of gait and mobility  Muscle weakness (generalized)     Problem List There are no problems to display for this patient.   Ronn Melena, PT, DPT, NCS 08/21/2020, 5:22 PM  [facility] [address] [place], [place], [address] Phone: [phone]   Fax:  [phone]  Name: Kelen Laura MRN: [id number] Date of Birth: 1923-02-19

## 2020-08-27 ENCOUNTER — Other Ambulatory Visit: Payer: Self-pay

## 2020-08-27 ENCOUNTER — Encounter: Payer: Self-pay | Admitting: Physical Therapy

## 2020-08-27 ENCOUNTER — Ambulatory Visit: Payer: No Typology Code available for payment source | Admitting: Physical Therapy

## 2020-08-27 DIAGNOSIS — M6281 Muscle weakness (generalized): Secondary | ICD-10-CM | POA: Diagnosis not present

## 2020-08-27 DIAGNOSIS — R29818 Other symptoms and signs involving the nervous system: Secondary | ICD-10-CM

## 2020-08-27 DIAGNOSIS — R2681 Unsteadiness on feet: Secondary | ICD-10-CM

## 2020-08-27 DIAGNOSIS — R2689 Other abnormalities of gait and mobility: Secondary | ICD-10-CM

## 2020-08-27 NOTE — Therapy (Signed)
[facility] [address] [place], [place], [address] Phone: [phone]   Fax:  [phone]  Physical Therapy Treatment  Patient Details  Name: Wayne Dudley MRN: [id number] Date of Birth: May 17, 1923 Referring Provider (PT): Carrington Clamp, MD   Encounter Date: 08/27/2020   PT End of Session - 08/27/20 1208    Visit Number 37    Number of Visits 45    Date for PT Re-Evaluation 11/03/20   90 day cert, 60 day POC   Authorization Type VA- 15 visits approved, expires 12/03/2020    Authorization - Visit Number 7    Authorization - Number of Visits 15    PT Start Time 1100    PT Stop Time 1143    PT Time Calculation (min) 43 min    Equipment Utilized During Treatment Gait belt    Activity Tolerance Patient tolerated treatment well;Patient limited by fatigue    Behavior During Therapy [facility] for tasks assessed/performed           Past Medical History:  Diagnosis Date  . Constipation   . Hypertension     History reviewed. No pertinent surgical history.  There were no vitals filed for this visit.   Subjective Assessment - 08/27/20 1102    Subjective Doing good. Has been walking at home with his son. L heel is feeling better today.    Pertinent History footpain 2/2 to frostbite injury, CKD, hard of hearing.    How long can you stand comfortably? can stand for about 1-2 minutes before having pain (in the feet and left ankle)    Diagnostic tests MRI of lumbar spine 11/10/19: multilevel degenerative changes, severe spinal canal stenosis at L3-L4 and L5-S1    Patient Stated Goals wants to get back to where he can do for himself.    Currently in Pain? Yes    Pain Score 6     Pain Location Foot    Pain Orientation Left    Pain Descriptors / Indicators Burning    Pain Type Chronic pain    Pain Onset More than a month ago                             [facility] Adult PT Treatment/Exercise - 08/27/20 1113       Bed Mobility   Supine to Sit Contact Guard/Touching assist   cues for log roll    Sit to Supine Minimal Assistance - Patient > 75%   for BLE onto mat     Transfers   Transfers Sit to Stand;Stand to Sit;Stand Pivot Transfers    Sit to Stand 4: Min guard    Stand to Sit 4: Min guard    Stand Pivot Transfers 4: Min guard;4: Administrator, arts Details (indicate cue type and reason) with RW    Comments 2 x 5 reps it <> stands from mat table with cues for forward lean to decr BLE bracing, during 2nd set cues for incr speed when performing       Ambulation/Gait   Ambulation/Gait Yes    Ambulation/Gait Assistance 4: Min assist    Ambulation/Gait Assistance Details verbal and tactile cues for tall posture and for L knee extension during stance phase. used theraband with colored visual targets on front of walker for improved step length B. W/C follow for safety    Ambulation Distance (Feet) 22 Feet   x1,  20 x 1, 25 x 1   Assistive device Rolling walker    Gait Pattern Step-through pattern;Left flexed knee in stance    Ambulation Surface Level;Indoor    Pre-Gait Activities standing at RW with theraband with colored targets: stepping with each leg towards target x7 reps B, cues for wider BOS and weight shifting, min guard for balance      Exercises   Exercises Other Exercises    Other Exercises  supine on mat table: performed AAROM hip flexion on R 2 x 5 reps with cues for controlled descent, on LLE performed PROM into hip flexion and cues for slow descent with AAROM x5 reps. performed hooklying hip ADD ball squeezes x8 reps                  PT Education - 08/27/20 1208    Education Details performing gait at home with son 2x a day vs. 1x per day    Person(s) Educated Patient;Child(ren)    Methods Explanation    Comprehension Verbalized understanding            PT Short Term Goals - 08/05/20 1432      PT SHORT TERM GOAL #1   Title Pt will be able to perform  stand pivot transfers with supervision to and from mat table with RW in order to decr caregiver burden. ALL STGS DUE 09/02/20    Baseline performing stand pivot with RW with min guard    Time 4    Period Weeks    Status Revised    Target Date 09/02/20      PT SHORT TERM GOAL #2   Title Pt will perform sit <> stand with supervision consistently with single UE support and with RW in order to improve functional LE strength.    Baseline supervision/min guard when more fatigued.    Time 4    Period Weeks    Status Revised      PT SHORT TERM GOAL #3   Title Pt will be able to stand for at least 5 minutes at RW with supervision in order to improve tolerance for ADLs.    Baseline 3 min 31 sec on 06/05/20 at RW CGA, 4 min and 30 seconds at RW with supervision/min guard on 08/05/20    Time 4    Period Weeks    Status Revised      PT SHORT TERM GOAL #4   Title Pt will ambulate 72' with RW min guard for improved mobility in home with son's assist.    Baseline ambulated 51' with RW with min A on 08/05/20    Time 4    Period Weeks    Status Revised             PT Long Term Goals - 08/05/20 1434      PT LONG TERM GOAL #1   Title Pt and pt's son/daughter will be independent in final HEP for LE strengthening/ROM. ALL LTGS 09/30/20    Baseline ongoing    Time 8    Period Weeks    Status On-going    Target Date 09/30/20      PT LONG TERM GOAL #2   Title Pt will be able to perform squat pivot vs. stand pivot transfers with RW from w/c to mat table with mod I in order to decr caregiver burden    Baseline min guard with RW    Time 8    Period Weeks  Status New      PT LONG TERM GOAL #3   Title Pt will ambulate 23' with RW and min guard for improved mobility in home with son's assist.    Baseline 20' with min A on 08/05/20    Time 8    Period Weeks    Status New      PT LONG TERM GOAL #4   Title Pt will tolerate standing activities at RW for at least 6 minutes in order to improve  tolerance for ADLs.    Baseline 3 min 31 sec on 06/05/20    Time 8    Period Weeks    Status Revised                 Plan - 08/27/20 1212    Clinical Impression Statement Pt able to ambulate 3 bouts of gait today for approx. 20' each. Towards end of session pt needing min A for stand pivot transfers with RW due to fatigue. Will continue to progress towards LTGS.    Personal Factors and Comorbidities Age;Past/Current Experience    Examination-Activity Limitations Bed Mobility;Stand;Transfers;Locomotion Level;Hygiene/Grooming;Bathing;Toileting    Examination-Participation Restrictions Community Activity;Cleaning;Meal Prep    Stability/Clinical Decision Making Evolving/Moderate complexity    Rehab Potential Fair    PT Frequency 2x / week   1-2x   PT Duration --   7-8 weeks   PT Treatment/Interventions Gait training;Therapeutic exercise;Neuromuscular re-education;Therapeutic activities;ADLs/Self Care Home Management;Patient/family education;Passive range of motion    PT Next Visit Plan STG check due next week. gait with RW. Will need w/c follow,  core/trunk/postural strengthening. SciFit in w/c, standing in RW. isometric hip ABD.    Consulted and Agree with Plan of Care Patient;Family member/caregiver    Family Member Consulted pt's son [name]           Patient will benefit from skilled therapeutic intervention in order to improve the following deficits and impairments:  Decreased balance, Decreased activity tolerance, Decreased coordination, Decreased knowledge of use of DME, Decreased mobility, Decreased range of motion, Difficulty walking, Decreased strength, Impaired flexibility, Postural dysfunction, Impaired sensation, Pain  Visit Diagnosis: Other abnormalities of gait and mobility  Muscle weakness (generalized)  Unsteadiness on feet  Other symptoms and signs involving the nervous system     Problem List There are no problems to display for this patient.   Drake Leach, PT,DPT  08/27/2020, 12:17 PM  [facility] [address] [place], [place], [address] Phone: [phone]   Fax:  [phone]  Name: Aydan Levitz MRN: [id number] Date of Birth: 1923/08/18

## 2020-08-29 ENCOUNTER — Other Ambulatory Visit: Payer: Self-pay

## 2020-08-29 ENCOUNTER — Ambulatory Visit: Payer: No Typology Code available for payment source | Admitting: Physical Therapy

## 2020-08-29 DIAGNOSIS — R2681 Unsteadiness on feet: Secondary | ICD-10-CM

## 2020-08-29 DIAGNOSIS — R2689 Other abnormalities of gait and mobility: Secondary | ICD-10-CM

## 2020-08-29 DIAGNOSIS — R29818 Other symptoms and signs involving the nervous system: Secondary | ICD-10-CM

## 2020-08-29 DIAGNOSIS — M6281 Muscle weakness (generalized): Secondary | ICD-10-CM

## 2020-08-29 NOTE — Therapy (Signed)
[facility] [address] [place], [place], [address] Phone: [phone]   Fax:  [phone]  Physical Therapy Treatment  Patient Details  Name: Wayne Dudley MRN: [id number] Date of Birth: December 10, 1922 Referring Provider (PT): [name], MD   Encounter Date: 08/29/2020   PT End of Session - 08/29/20 1208    Visit Number 38    Number of Visits 45    Date for PT Re-Evaluation 99/35/70   90 day cert, 60 day POC   Authorization Type VA- 15 visits approved, expires 12/03/2020    Authorization - Visit Number 8    Authorization - Number of Visits 15    PT Start Time 1101    PT Stop Time 1143    PT Time Calculation (min) 42 min    Equipment Utilized During Treatment Gait belt    Activity Tolerance Patient tolerated treatment well;Patient limited by fatigue    Behavior During Therapy W[facility] for tasks assessed/performed           Past Medical History:  Diagnosis Date   Constipation    Hypertension     No past surgical history on file.  There were no vitals filed for this visit.   Subjective Assessment - 08/29/20 1104    Subjective Things are going well.    Pertinent History footpain 2/2 to frostbite injury, CKD, hard of hearing.    How long can you stand comfortably? can stand for about 1-2 minutes before having pain (in the feet and left ankle)    Diagnostic tests MRI of lumbar spine 11/10/19: multilevel degenerative changes, severe spinal canal stenosis at L3-L4 and L5-S1    Patient Stated Goals wants to get back to where he can do for himself.    Currently in Pain? Yes    Pain Score 6     Pain Location Foot    Pain Orientation Left    Pain Descriptors / Indicators Burning    Pain Type Chronic pain    Pain Onset More than a month ago                             [facility] Adult PT Treatment/Exercise - 08/29/20 0001      Transfers   Transfers Sit to Stand;Stand to Sit;Stand Pivot  Transfers    Sit to Stand 4: Min guard    Sit to Stand Details (indicate cue type and reason) throughout session from w/c and from SciFit seat    Stand to Sit 5: Supervision;4: Min guard    Stand Pivot Transfers 4: Min guard    Stand Pivot Transfer Details (indicate cue type and reason) from w/c > SciFit with RW      Ambulation/Gait   Ambulation/Gait Yes    Ambulation/Gait Assistance 4: Min assist    Ambulation/Gait Assistance Details w/c follow for safety. cues for incr L quad activation and verbal/tactile cues through trunk for more upright posture, pt with improved foot clearance with upright posture. during initial bout pt demonstrating more narrow BOS with LLE    Ambulation Distance (Feet) 25 Feet   x1, 35 x 1, 28 x 1   Assistive device Rolling walker    Gait Pattern Step-through pattern;Left flexed knee in stance    Ambulation Surface Level;Indoor      Exercises   Exercises Other Exercises    Other Exercises  with pt seated in w/c tried seated stepper (with band resistance),  with pt able to push more with RLE>LLE, will look into options for potential use for home, performed x2 mins      Knee/Hip Exercises: Stretches   Passive Hamstring Stretch Left;3 reps;30 seconds    Passive Hamstring Stretch Limitations with pt seated in w/c - therapist placing pt's LLE on thigh and applying overpressure to L ankle for additional calf stretch, performed after 1st bout of gait       Knee/Hip Exercises: Aerobic   Other Aerobic SciFit x6 mins with BLE and BUE at gear 3, an additional minute performed with BLE only. verbal and tactile cues to keep hips in neutral vs. too much ABD, additional cues to really kick through LLE      Knee/Hip Exercises: Seated   Long Arc Quad Strengthening;AROM;Both;2 sets;10 reps    Long Arc Quad Limitations no weight on LLE, 3# on RLE - visual cue on how high to kick leg                  PT Education - 08/29/20 1200    Education Details needing to add 3  more appts            PT Short Term Goals - 08/29/20 1201      PT SHORT TERM GOAL #1   Title Pt will be able to perform stand pivot transfers with supervision to and from mat table with RW in order to decr caregiver burden. ALL STGS DUE 09/02/20    Baseline performing stand pivot with RW with min guard on 08/29/20    Time 4    Period Weeks    Status Not Met    Target Date 09/02/20      PT SHORT TERM GOAL #2   Title Pt will perform sit <> stand with supervision consistently with single UE support and with RW in order to improve functional LE strength.    Baseline supervision and min guard when more fatigued 08/29/20    Time 4    Period Weeks    Status Not Met      PT SHORT TERM GOAL #3   Title Pt will be able to stand for at least 5 minutes at RW with supervision in order to improve tolerance for ADLs.    Baseline 3 min 31 sec on 06/05/20 at RW CGA, 4 min and 30 seconds at [place] with supervision/min guard on 08/05/20    Time 4    Period Weeks    Status Revised      PT SHORT TERM GOAL #4   Title Pt will ambulate 16' with RW min guard for improved mobility in home with son's assist.    Baseline ambulated 74' with RW with min A on 08/05/20, ambulated 73' with RW and min A on 08/29/20    Time 4    Period Weeks    Status Not Met              PT Long Term Goals - 08/29/20 1211      PT LONG TERM GOAL #1   Title Pt and pt's son/daughter will be independent in final HEP for LE strengthening/ROM. ALL LTGS 09/30/20    Baseline ongoing    Time 8    Period Weeks    Status On-going      PT LONG TERM GOAL #2   Title Pt will be able to perform stand pivot transfers with RW from w/c to mat table with supervision in order to decr caregiver  burden    Baseline min guard with RW    Time 8    Period Weeks    Status New      PT LONG TERM GOAL #3   Title Pt will ambulate 4' with RW and min guard for improved mobility in home with son's assist.    Baseline 37' with min A on 08/05/20     Time 8    Period Weeks    Status Revised      PT LONG TERM GOAL #4   Title Pt will tolerate standing activities at [place] for at least 6 minutes in order to improve tolerance for ADLs.    Baseline 3 min 31 sec on 06/05/20    Time 8    Period Weeks    Status Revised                 Plan - 08/29/20 1214    Clinical Impression Statement Began to check STGs today - pt ambulated a max distance of 26' with RW, pt continuing to need min A for balance, esp when more fatigued. Pt continues to perform stand pivot transfers with min guard for safety. With sit <> stands, pt needs min guard for standing and only supervision/min guard when sitting back into chair/mat. Pt has begun to ambulate small distances at home with son. Remainder of session focused on BLE strengthening in seated and SciFit, pt with incr difficulty when using SciFit with BLE only. Will continue to progress towards LTGs.    Personal Factors and Comorbidities Age;Past/Current Experience    Examination-Activity Limitations Bed Mobility;Stand;Transfers;Locomotion Level;Hygiene/Grooming;Bathing;Toileting    Examination-Participation Restrictions Community Activity;Cleaning;Meal Prep    Stability/Clinical Decision Making Evolving/Moderate complexity    Rehab Potential Fair    PT Frequency 2x / week   1-2x   PT Duration --   7-8 weeks   PT Treatment/Interventions Gait training;Therapeutic exercise;Neuromuscular re-education;Therapeutic activities;ADLs/Self Care Home Management;Patient/family education;Passive range of motion    PT Next Visit Plan check last STG. gait with RW. Will need w/c follow,  core/trunk/postural strengthening. SciFit in w/c, standing in RW. isometric hip ABD and quad strength.    Consulted and Agree with Plan of Care Patient;Family member/caregiver    Family Member Consulted pt's son Jeneen Rinks           Patient will benefit from skilled therapeutic intervention in order to improve the following deficits and  impairments:  Decreased balance, Decreased activity tolerance, Decreased coordination, Decreased knowledge of use of DME, Decreased mobility, Decreased range of motion, Difficulty walking, Decreased strength, Impaired flexibility, Postural dysfunction, Impaired sensation, Pain  Visit Diagnosis: Other abnormalities of gait and mobility  Muscle weakness (generalized)  Unsteadiness on feet  Other symptoms and signs involving the nervous system     Problem List There are no problems to display for this patient.   Arliss Journey, PT, DPT  08/29/2020, 12:16 PM  [facility] [address] [place], [place], [address] Phone: [phone]   Fax:  [phone]  Name: Ken Bonn MRN: [id number] Date of Birth: 07-Mar-1923

## 2020-09-02 ENCOUNTER — Other Ambulatory Visit: Payer: Self-pay

## 2020-09-02 ENCOUNTER — Ambulatory Visit: Payer: No Typology Code available for payment source | Attending: Psychiatry

## 2020-09-02 DIAGNOSIS — R293 Abnormal posture: Secondary | ICD-10-CM | POA: Diagnosis present

## 2020-09-02 DIAGNOSIS — M6281 Muscle weakness (generalized): Secondary | ICD-10-CM | POA: Insufficient documentation

## 2020-09-02 DIAGNOSIS — R2689 Other abnormalities of gait and mobility: Secondary | ICD-10-CM | POA: Diagnosis present

## 2020-09-02 DIAGNOSIS — R2681 Unsteadiness on feet: Secondary | ICD-10-CM

## 2020-09-02 DIAGNOSIS — R29818 Other symptoms and signs involving the nervous system: Secondary | ICD-10-CM | POA: Diagnosis present

## 2020-09-02 NOTE — Therapy (Addendum)
[facility] [address] [place], [place], [address] Phone: [phone]   Fax:  [phone]  Physical Therapy Treatment  Patient Details  Name: Wayne Dudley MRN: [id number] Date of Birth: August 22, 1923 Referring Provider (PT): Carrington Clamp, MD   Encounter Date: 09/02/2020   PT End of Session - 09/02/20 1359    Visit Number 39    Number of Visits 45    Date for PT Re-Evaluation 11/03/20   90 day cert, 60 day POC   Authorization Type VA- 15 visits approved, expires 12/03/2020    Authorization - Visit Number 8    Authorization - Number of Visits 15    PT Start Time 1145    PT Stop Time 1229    PT Time Calculation (min) 44 min    Equipment Utilized During Treatment Gait belt    Activity Tolerance Patient tolerated treatment well;Patient limited by fatigue    Behavior During Therapy [facility] for tasks assessed/performed           Past Medical History:  Diagnosis Date  . Constipation   . Hypertension     History reviewed. No pertinent surgical history.  There were no vitals filed for this visit.   Subjective Assessment - 09/02/20 1148    Subjective Pt reports no changes. No falls. Pt has been walking with son/daughter in the home.    Pertinent History footpain 2/2 to frostbite injury, CKD, hard of hearing.    How long can you stand comfortably? can stand for about 1-2 minutes before having pain (in the feet and left ankle)    Diagnostic tests MRI of lumbar spine 11/10/19: multilevel degenerative changes, severe spinal canal stenosis at L3-L4 and L5-S1    Patient Stated Goals wants to get back to where he can do for himself.    Currently in Pain? Yes    Pain Score 7     Pain Location Foot    Pain Orientation Left    Pain Descriptors / Indicators Burning    Pain Type Chronic pain    Pain Onset More than a month ago                             [facility] Adult PT Treatment/Exercise - 09/02/20 1210       Transfers   Transfers Sit to Stand;Stand to Sit    Sit to Stand 4: Min guard;5: Supervision    Sit to Stand Details (indicate cue type and reason) CGA for initial rep and supervision for remaining STS throughout session.    Stand to Sit 5: Supervision      Ambulation/Gait   Ambulation/Gait Yes    Ambulation/Gait Assistance 4: Min guard    Ambulation/Gait Assistance Details W/C follow for safety. Pt cued on not allowing L foot to catch on floor and to clear it completely. Pt instructed on proper positioning within RW and upright posture.    Ambulation Distance (Feet) 54 Feet   59' first, then 66', then 30', then 32'   Assistive device Rolling walker    Gait Pattern Step-through pattern;Left flexed knee in stance    Ambulation Surface Level;Indoor      Knee/Hip Exercises: Aerobic   Other Aerobic SciFit x8 mins with BLE and BUE at gear 3 for 5.5 min, an additional 2 minutes performed with BLE only and last 30 s with BUE and BLE. Tactile cues to keep L hip in neutral. PT  guarding W/C for stability to prevent posterior translation during activity.                    PT Short Term Goals - 09/02/20 1540      PT SHORT TERM GOAL #1   Title Pt will be able to perform stand pivot transfers with supervision to and from mat table with RW in order to decr caregiver burden. ALL STGS DUE 09/02/20    Baseline performing stand pivot with RW with min guard on 08/29/20    Time 4    Period Weeks    Status Not Met    Target Date 09/02/20      PT SHORT TERM GOAL #2   Title Pt will perform sit <> stand with supervision consistently with single UE support and with RW in order to improve functional LE strength.    Baseline supervision and min guard when more fatigued 08/29/20; min guard initially and supervision for all other STS transfers 09/02/20    Time 4    Period Weeks    Status Partially Met      PT SHORT TERM GOAL #3   Title Pt will be able to stand for at least 5 minutes at RW with  supervision in order to improve tolerance for ADLs.    Baseline 3 min 31 sec on 06/05/20 at RW CGA, 4 min and 30 seconds at [place] with supervision/min guard on 08/05/20    Time 4    Period Weeks    Status Partially Met      PT SHORT TERM GOAL #4   Title Pt will ambulate 52' with RW min guard for improved mobility in home with son's assist.    Baseline ambulated 100' with RW with min A on 08/05/20, ambulated 49' with RW and min A on 08/29/20, 80' with RW and supervision 09/02/20    Time 4    Period Weeks    Status Achieved             PT Long Term Goals - 08/29/20 1211      PT LONG TERM GOAL #1   Title Pt and pt's son/daughter will be independent in final HEP for LE strengthening/ROM. ALL LTGS 09/30/20    Baseline ongoing    Time 8    Period Weeks    Status On-going      PT LONG TERM GOAL #2   Title Pt will be able to perform stand pivot transfers with RW from w/c to mat table with supervision in order to decr caregiver burden    Baseline min guard with RW    Time 8    Period Weeks    Status New      PT LONG TERM GOAL #3   Title Pt will ambulate 50' with RW and min guard for improved mobility in home with son's assist.    Baseline 23' with min A on 08/05/20    Time 8    Period Weeks    Status Revised      PT LONG TERM GOAL #4   Title Pt will tolerate standing activities at [place] for at least 6 minutes in order to improve tolerance for ADLs.    Baseline 3 min 31 sec on 06/05/20    Time 8    Period Weeks    Status Revised                 Plan - 09/02/20 1400  Clinical Impression Statement Today's skilled PT sessoin focused on checking last STG of walking 44' with RW and CGA which pt met by ambulating 15' with RW and CGA. Pt continues to work on increasing gait tolerance and optimizing gait mechanics and foot clearance. Pt is improving with transfers and gait need less assistance, only requiring CGA-supervision at this time. Pt needs CGA for inital STS but only supervision  for remaining. PT to continue to progress towards LTGs.    Personal Factors and Comorbidities Age;Past/Current Experience    Examination-Activity Limitations Bed Mobility;Stand;Transfers;Locomotion Level;Hygiene/Grooming;Bathing;Toileting    Examination-Participation Restrictions Community Activity;Cleaning;Meal Prep    Stability/Clinical Decision Making Evolving/Moderate complexity    Rehab Potential Fair    PT Frequency 2x / week   1-2x   PT Duration --   7-8 weeks   PT Treatment/Interventions Gait training;Therapeutic exercise;Neuromuscular re-education;Therapeutic activities;ADLs/Self Care Home Management;Patient/family education;Passive range of motion    PT Next Visit Plan Progress note next visit. Gait with RW. Will need w/c follow. Core/trunk/postural strengthening. SciFit in w/c, standing in RW. isometric hip ABD and quad strength. Repeated STS training.    Consulted and Agree with Plan of Care Patient;Family member/caregiver    Family Member Consulted pt's son Jeneen Rinks           Patient will benefit from skilled therapeutic intervention in order to improve the following deficits and impairments:  Decreased balance, Decreased activity tolerance, Decreased coordination, Decreased knowledge of use of DME, Decreased mobility, Decreased range of motion, Difficulty walking, Decreased strength, Impaired flexibility, Postural dysfunction, Impaired sensation, Pain  Visit Diagnosis: Other abnormalities of gait and mobility  Muscle weakness (generalized)  Unsteadiness on feet     Problem List There are no problems to display for this patient.   Rosalita Levan, SPT 09/02/2020, 4:03 PM  [facility] [address] [place], [place], [address] Phone: [phone]   Fax:  [phone]  Name: Wayne Dudley MRN: [id number] Date of Birth: 1923-02-04

## 2020-09-04 ENCOUNTER — Ambulatory Visit: Payer: No Typology Code available for payment source

## 2020-09-05 ENCOUNTER — Ambulatory Visit: Payer: No Typology Code available for payment source | Admitting: Physical Therapy

## 2020-09-05 ENCOUNTER — Encounter: Payer: Self-pay | Admitting: Physical Therapy

## 2020-09-05 ENCOUNTER — Other Ambulatory Visit: Payer: Self-pay

## 2020-09-05 DIAGNOSIS — R2681 Unsteadiness on feet: Secondary | ICD-10-CM

## 2020-09-05 DIAGNOSIS — R2689 Other abnormalities of gait and mobility: Secondary | ICD-10-CM

## 2020-09-05 DIAGNOSIS — M6281 Muscle weakness (generalized): Secondary | ICD-10-CM

## 2020-09-05 DIAGNOSIS — R29818 Other symptoms and signs involving the nervous system: Secondary | ICD-10-CM

## 2020-09-05 NOTE — Therapy (Signed)
[facility] [address] [place], [place], [address] Phone: [phone]   Fax:  [phone]  Physical Therapy Treatment/10th Visit Progress Note  Patient Details  Name: Wayne Dudley MRN: [id number] Date of Birth: 1923-06-18 Referring Provider (PT): Carrington Clamp, MD  10th Visit Physical Therapy Progress Note  Dates of Reporting Period: 08/05/20 to 09/05/20   Encounter Date: 09/05/2020   PT End of Session - 09/05/20 1200    Visit Number 40    Number of Visits 45    Date for PT Re-Evaluation 11/03/20   90 day cert, 60 day POC   Authorization Type VA- 15 visits approved, expires 12/03/2020    Authorization - Visit Number 9    Authorization - Number of Visits 15    PT Start Time 1102    PT Stop Time 1142    PT Time Calculation (min) 40 min    Equipment Utilized During Treatment Gait belt    Activity Tolerance Patient tolerated treatment well;Patient limited by fatigue    Behavior During Therapy [facility] for tasks assessed/performed           Past Medical History:  Diagnosis Date  . Constipation   . Hypertension     History reviewed. No pertinent surgical history.  There were no vitals filed for this visit.   Subjective Assessment - 09/05/20 1105    Subjective Felt really good after last time. Still doing a lot of walking at home.    Pertinent History footpain 2/2 to frostbite injury, CKD, hard of hearing.    How long can you stand comfortably? can stand for about 1-2 minutes before having pain (in the feet and left ankle)    Diagnostic tests MRI of lumbar spine 11/10/19: multilevel degenerative changes, severe spinal canal stenosis at L3-L4 and L5-S1    Patient Stated Goals wants to get back to where he can do for himself.    Currently in Pain? Yes    Pain Score 7     Pain Location Foot   ankle   Pain Orientation Left    Pain Descriptors / Indicators Burning    Pain Type Chronic pain    Pain Onset More than  a month ago    Aggravating Factors  cold makes it worse.    Pain Relieving Factors tylenol.                             [facility] Adult PT Treatment/Exercise - 09/05/20 1112      Transfers   Stand Pivot Transfers 4: Min guard    Stand Pivot Transfer Details (indicate cue type and reason) w/c <> mat table with RW      Ambulation/Gait   Ambulation/Gait Yes    Ambulation/Gait Assistance 4: Min guard    Ambulation/Gait Assistance Details w/c follow for safety with seated rest break taken between each bout, verbal/tactile cues throughout for posture and quad activation    Ambulation Distance (Feet) 32 Feet   x1, 35 x 1, 43 x 1   Assistive device Rolling walker    Gait Pattern Step-through pattern;Left flexed knee in stance    Ambulation Surface Level;Indoor      High Level Balance   High Level Balance Activities Other (comment)    High Level Balance Comments standing in RW at edge of mat: grabbing bean bags and tossing into crate  x 15 reps ,reaching with both R/L hand, min guard/min  A for balance , cues for tall posture and standing tall through BLE for quad activation and then performing an additional 8 reps - pt more fatigued during 2nd bout and performing with incr B knee flexion.                     PT Short Term Goals - 09/02/20 1540      PT SHORT TERM GOAL #1   Title Pt will be able to perform stand pivot transfers with supervision to and from mat table with RW in order to decr caregiver burden. ALL STGS DUE 09/02/20    Baseline performing stand pivot with RW with min guard on 08/29/20    Time 4    Period Weeks    Status Not Met    Target Date 09/02/20      PT SHORT TERM GOAL #2   Title Pt will perform sit <> stand with supervision consistently with single UE support and with RW in order to improve functional LE strength.    Baseline supervision and min guard when more fatigued 08/29/20; min guard initially and supervision for all other STS transfers  09/02/20    Time 4    Period Weeks    Status Partially Met      PT SHORT TERM GOAL #3   Title Pt will be able to stand for at least 5 minutes at RW with supervision in order to improve tolerance for ADLs.    Baseline 3 min 31 sec on 06/05/20 at RW CGA, 4 min and 30 seconds at RW with supervision/min guard on 08/05/20    Time 4    Period Weeks    Status Partially Met      PT SHORT TERM GOAL #4   Title Pt will ambulate 86' with RW min guard for improved mobility in home with son's assist.    Baseline ambulated 71' with RW with min A on 08/05/20, ambulated 49' with RW and min A on 08/29/20, 35' with RW and supervision 09/02/20    Time 4    Period Weeks    Status Achieved             PT Long Term Goals - 08/29/20 1211      PT LONG TERM GOAL #1   Title Pt and pt's son/daughter will be independent in final HEP for LE strengthening/ROM. ALL LTGS 09/30/20    Baseline ongoing    Time 8    Period Weeks    Status On-going      PT LONG TERM GOAL #2   Title Pt will be able to perform stand pivot transfers with RW from w/c to mat table with supervision in order to decr caregiver burden    Baseline min guard with RW    Time 8    Period Weeks    Status New      PT LONG TERM GOAL #3   Title Pt will ambulate 11' with RW and min guard for improved mobility in home with son's assist.    Baseline 82' with min A on 08/05/20    Time 8    Period Weeks    Status Revised      PT LONG TERM GOAL #4   Title Pt will tolerate standing activities at RW for at least 6 minutes in order to improve tolerance for ADLs.    Baseline 3 min 31 sec on 06/05/20    Time 8    Period Weeks  Status Revised                 Plan - 09/05/20 1207    Clinical Impression Statement 10th visit progress note: Recently assessed pt's STGs with pt meeting STG #4 in regards to gait distance with RW - able to ambulate 64' with RW and min guard/supervision. Pt today able to tolerate approx. 3 minutes of dynamic standing  balance with decr UE support while grabbing bean bags. Pt continues to need min guard for stand pivot transfers with RW. Pt is progressing well, will continue to progress towards LTGs.    Personal Factors and Comorbidities Age;Past/Current Experience    Examination-Activity Limitations Bed Mobility;Stand;Transfers;Locomotion Level;Hygiene/Grooming;Bathing;Toileting    Examination-Participation Restrictions Community Activity;Cleaning;Meal Prep    Stability/Clinical Decision Making Evolving/Moderate complexity    Rehab Potential Fair    PT Frequency 2x / week   1-2x   PT Duration --   7-8 weeks   PT Treatment/Interventions Gait training;Therapeutic exercise;Neuromuscular re-education;Therapeutic activities;ADLs/Self Care Home Management;Patient/family education;Passive range of motion    PT Next Visit Plan Gait with RW. Will need w/c follow. Core/trunk/postural strengthening. SciFit in w/c, standing in RW. isometric hip ABD and quad strength. Repeated STS training.    Consulted and Agree with Plan of Care Patient;Family member/caregiver    Family Member Consulted pt's son Jeneen Rinks           Patient will benefit from skilled therapeutic intervention in order to improve the following deficits and impairments:  Decreased balance, Decreased activity tolerance, Decreased coordination, Decreased knowledge of use of DME, Decreased mobility, Decreased range of motion, Difficulty walking, Decreased strength, Impaired flexibility, Postural dysfunction, Impaired sensation, Pain  Visit Diagnosis: Other abnormalities of gait and mobility  Muscle weakness (generalized)  Unsteadiness on feet  Other symptoms and signs involving the nervous system     Problem List There are no problems to display for this patient.   Arliss Journey, PT, DPT 09/05/2020, 12:07 PM  [facility] [address] [place], [place], [address] Phone: [phone]    Fax:  [phone]  Name: Wayne Dudley MRN: [id number] Date of Birth: 23-Jan-1923

## 2020-09-09 ENCOUNTER — Ambulatory Visit: Payer: No Typology Code available for payment source | Admitting: Physical Therapy

## 2020-09-09 ENCOUNTER — Encounter: Payer: Self-pay | Admitting: Physical Therapy

## 2020-09-09 ENCOUNTER — Other Ambulatory Visit: Payer: Self-pay

## 2020-09-09 DIAGNOSIS — R2689 Other abnormalities of gait and mobility: Secondary | ICD-10-CM | POA: Diagnosis not present

## 2020-09-09 DIAGNOSIS — M6281 Muscle weakness (generalized): Secondary | ICD-10-CM

## 2020-09-09 DIAGNOSIS — R29818 Other symptoms and signs involving the nervous system: Secondary | ICD-10-CM

## 2020-09-09 DIAGNOSIS — R2681 Unsteadiness on feet: Secondary | ICD-10-CM

## 2020-09-09 NOTE — Therapy (Signed)
[facility] [address] [place], [place], [address] Phone: [phone]   Fax:  [phone]  Physical Therapy Treatment  Patient Details  Name: Wayne Dudley MRN: [id number] Date of Birth: 02-02-1923 Referring Provider (PT): [name], MD   Encounter Date: 09/09/2020   PT End of Session - 09/09/20 1413    Visit Number 41    Number of Visits 45    Date for PT Re-Evaluation 12/87/86   90 day cert, 60 day POC   Authorization Type VA- 15 visits approved, expires 12/03/2020    Authorization - Visit Number 10    Authorization - Number of Visits 15    PT Start Time 7672    PT Stop Time 1230    PT Time Calculation (min) 41 min    Equipment Utilized During Treatment Gait belt    Activity Tolerance Patient tolerated treatment well;Patient limited by fatigue    Behavior During Therapy [facility] for tasks assessed/performed           Past Medical History:  Diagnosis Date  . Constipation   . Hypertension     History reviewed. No pertinent surgical history.  There were no vitals filed for this visit.   Subjective Assessment - 09/09/20 1152    Subjective Has a lower gum abscess and is now taking Amoxicillin - they want to prevent it from becoming an infection.    Pertinent History footpain 2/2 to frostbite injury, CKD, hard of hearing.    How long can you stand comfortably? can stand for about 1-2 minutes before having pain (in the feet and left ankle)    Diagnostic tests MRI of lumbar spine 11/10/19: multilevel degenerative changes, severe spinal canal stenosis at L3-L4 and L5-S1    Patient Stated Goals wants to get back to where he can do for himself.    Currently in Pain? Yes    Pain Score 7     Pain Location Foot    Pain Orientation Left    Pain Descriptors / Indicators Burning    Pain Type Chronic pain    Pain Onset More than a month ago    Aggravating Factors  cold makes it worse.    Pain Relieving Factors  tylenol                             [facility] Adult PT Treatment/Exercise - 09/09/20 0001      Bed Mobility   Supine to Sit Contact Guard/Touching assist   cues for log roll   Sit to Supine Supervision/Verbal cueing      Transfers   Stand Pivot Transfers 4: Min guard    Stand Pivot Transfer Details (indicate cue type and reason) w/c <> mat table    Comments x5 reps sit <> stand from mat table with RW, cues to stand and sit right back down vs. standing and weight shifting before sitting - with min guard      Ambulation/Gait   Ambulation/Gait Yes    Ambulation/Gait Assistance 4: Min guard    Ambulation/Gait Assistance Details w/c follow with seated rest breaks taken between each bout. cues for posture and quad activation throughout    Ambulation Distance (Feet) 47 Feet   x1, 43 x 1, 42 x 1    Assistive device Rolling walker    Gait Pattern Step-through pattern;Left flexed knee in stance    Ambulation Surface Indoor;Level    Gait Comments  during last 2 bouts of gait - pt weaving in and out of 3 cones to practice turns with min guard and cues to keep RW on the ground      Exercises   Exercises Other Exercises    Other Exercises  sidelying attempted clamshells B- pt able to perform 2 with very minimal range with therapist assisting pt maintain position, hooklying isometric hip ABD x10 reps with cues for pt to push out into therapist's hand, in R sidelying working on L hip flexion/extension with assist from therapist x10 reps - verbal and tactile cues for proper technique                    PT Short Term Goals - 09/02/20 1540      PT SHORT TERM GOAL #1   Title Pt will be able to perform stand pivot transfers with supervision to and from mat table with RW in order to decr caregiver burden. ALL STGS DUE 09/02/20    Baseline performing stand pivot with RW with min guard on 08/29/20    Time 4    Period Weeks    Status Not Met    Target Date 09/02/20      PT SHORT  TERM GOAL #2   Title Pt will perform sit <> stand with supervision consistently with single UE support and with RW in order to improve functional LE strength.    Baseline supervision and min guard when more fatigued 08/29/20; min guard initially and supervision for all other STS transfers 09/02/20    Time 4    Period Weeks    Status Partially Met      PT SHORT TERM GOAL #3   Title Pt will be able to stand for at least 5 minutes at RW with supervision in order to improve tolerance for ADLs.    Baseline 3 min 31 sec on 06/05/20 at RW CGA, 4 min and 30 seconds at [place] with supervision/min guard on 08/05/20    Time 4    Period Weeks    Status Partially Met      PT SHORT TERM GOAL #4   Title Pt will ambulate 54' with RW min guard for improved mobility in home with son's assist.    Baseline ambulated 6' with RW with min A on 08/05/20, ambulated 30' with RW and min A on 08/29/20, 38' with RW and supervision 09/02/20    Time 4    Period Weeks    Status Achieved             PT Long Term Goals - 08/29/20 1211      PT LONG TERM GOAL #1   Title Pt and pt's son/daughter will be independent in final HEP for LE strengthening/ROM. ALL LTGS 09/30/20    Baseline ongoing    Time 8    Period Weeks    Status On-going      PT LONG TERM GOAL #2   Title Pt will be able to perform stand pivot transfers with RW from w/c to mat table with supervision in order to decr caregiver burden    Baseline min guard with RW    Time 8    Period Weeks    Status New      PT LONG TERM GOAL #3   Title Pt will ambulate 80' with RW and min guard for improved mobility in home with son's assist.    Baseline 51' with min A on 08/05/20  Time 8    Period Weeks    Status Revised      PT LONG TERM GOAL #4   Title Pt will tolerate standing activities at RW for at least 6 minutes in order to improve tolerance for ADLs.    Baseline 3 min 31 sec on 06/05/20    Time 8    Period Weeks    Status Revised                  Plan - 09/09/20 1423    Clinical Impression Statement Pt improving on gait distance - in 3 bouts of gait ambulated ~130' with min guard throughout and performing obstacle negotiation with weaving in and out of 2-3 cones. Remainder of session focused on supine/sidelying strengthening for BLE. Will continue to progress towards LTGs.    Personal Factors and Comorbidities Age;Past/Current Experience    Examination-Activity Limitations Bed Mobility;Stand;Transfers;Locomotion Level;Hygiene/Grooming;Bathing;Toileting    Examination-Participation Restrictions Community Activity;Cleaning;Meal Prep    Stability/Clinical Decision Making Evolving/Moderate complexity    Rehab Potential Fair    PT Frequency 2x / week   1-2x   PT Duration --   7-8 weeks   PT Treatment/Interventions Gait training;Therapeutic exercise;Neuromuscular re-education;Therapeutic activities;ADLs/Self Care Home Management;Patient/family education;Passive range of motion    PT Next Visit Plan Gait with RW. Will need w/c follow. Core/trunk/postural strengthening. SciFit in w/c, standing in RW. isometric hip ABD and quad strength. Repeated STS training.    Consulted and Agree with Plan of Care Patient;Family member/caregiver    Family Member Consulted pt's son Jeneen Rinks           Patient will benefit from skilled therapeutic intervention in order to improve the following deficits and impairments:  Decreased balance, Decreased activity tolerance, Decreased coordination, Decreased knowledge of use of DME, Decreased mobility, Decreased range of motion, Difficulty walking, Decreased strength, Impaired flexibility, Postural dysfunction, Impaired sensation, Pain  Visit Diagnosis: Other abnormalities of gait and mobility  Muscle weakness (generalized)  Unsteadiness on feet  Other symptoms and signs involving the nervous system     Problem List There are no problems to display for this patient.   Arliss Journey, PT,  DPT  09/09/2020, 2:25 PM  [facility] [address] [place], [place], [address] Phone: [phone]   Fax:  [phone]  Name: Wayne Dudley MRN: [id number] Date of Birth: [birth year]

## 2020-09-11 ENCOUNTER — Ambulatory Visit: Payer: No Typology Code available for payment source | Admitting: Physical Therapy

## 2020-09-11 ENCOUNTER — Encounter: Payer: Self-pay | Admitting: Physical Therapy

## 2020-09-11 ENCOUNTER — Other Ambulatory Visit: Payer: Self-pay

## 2020-09-11 DIAGNOSIS — R2689 Other abnormalities of gait and mobility: Secondary | ICD-10-CM | POA: Diagnosis not present

## 2020-09-11 DIAGNOSIS — R293 Abnormal posture: Secondary | ICD-10-CM

## 2020-09-11 DIAGNOSIS — R29818 Other symptoms and signs involving the nervous system: Secondary | ICD-10-CM

## 2020-09-11 DIAGNOSIS — R2681 Unsteadiness on feet: Secondary | ICD-10-CM

## 2020-09-11 DIAGNOSIS — M6281 Muscle weakness (generalized): Secondary | ICD-10-CM

## 2020-09-11 NOTE — Therapy (Signed)
[facility] [address] [place], [place], [address] Phone: [phone]   Fax:  [phone]  Physical Therapy Treatment  Patient Details  Name: Wayne Dudley MRN: [id number] Date of Birth: Jan 14, 1923 Referring Provider (PT): Carrington Clamp, MD   Encounter Date: 09/11/2020   PT End of Session - 09/11/20 1201    Visit Number 42    Number of Visits 45    Date for PT Re-Evaluation 11/03/20   90 day cert, 60 day POC   Authorization Type VA- 15 visits approved, expires 12/03/2020    Authorization - Visit Number 10    Authorization - Number of Visits 15    PT Start Time 1104    PT Stop Time 1145    PT Time Calculation (min) 41 min    Equipment Utilized During Treatment Gait belt    Activity Tolerance Patient tolerated treatment well;Patient limited by fatigue    Behavior During Therapy WFL for tasks assessed/performed           Past Medical History:  Diagnosis Date   Constipation    Hypertension     History reviewed. No pertinent surgical history.  There were no vitals filed for this visit.   Subjective Assessment - 09/11/20 1106    Subjective Lower gum is feeling better.    Pertinent History footpain 2/2 to frostbite injury, CKD, hard of hearing.    How long can you stand comfortably? can stand for about 1-2 minutes before having pain (in the feet and left ankle)    Diagnostic tests MRI of lumbar spine 11/10/19: multilevel degenerative changes, severe spinal canal stenosis at L3-L4 and L5-S1    Patient Stated Goals wants to get back to where he can do for himself.    Currently in Pain? Yes    Pain Score 7     Pain Location Foot    Pain Orientation Left    Pain Descriptors / Indicators Burning    Pain Type Chronic pain    Pain Onset More than a month ago    Aggravating Factors  cold    Pain Relieving Factors tylenol                             [facility] Adult PT Treatment/Exercise -  09/11/20 1135      Transfers   Stand Pivot Transfers 4: Min Astronomer Details (indicate cue type and reason) from w/c > SciFit      Ambulation/Gait   Ambulation/Gait Yes    Ambulation/Gait Assistance 4: Min guard;5: Supervision    Ambulation/Gait Assistance Details w/c follow for seated rest break between each bout. cues for posture throughout for improved foot clearance    Ambulation Distance (Feet) 25 Feet   x1, 34 x 1, 55 x 1   Assistive device Rolling walker    Gait Pattern Step-through pattern;Left flexed knee in stance    Ambulation Surface Indoor;Level      Exercises   Exercises Other Exercises    Other Exercises  LAQs with LLE x10 reps no weight, additional x10 reps with 1# ankle weight - visual cue on how high to kick leg  Seated isometric hip ABD with gait belt around thighs and pushing out with 3 second holds x10 reps  Seated AAROM LLE heel slides with L foot on pillow case on floor to decr friction x10 reps      Knee/Hip  Exercises: Aerobic   Other Aerobic SciFit for 6 minutes total for strengthening, ROM, activity tolerance - alternating each minute with BLE/BUE and then legs only                  PT Education - 09/11/20 1201    Education Details adding in an extra bout of walking at home with son.    Person(s) Educated Patient;Child(ren)    Methods Explanation    Comprehension Verbalized understanding            PT Short Term Goals - 09/02/20 1540      PT SHORT TERM GOAL #1   Title Pt will be able to perform stand pivot transfers with supervision to and from mat table with RW in order to decr caregiver burden. ALL STGS DUE 09/02/20    Baseline performing stand pivot with RW with min guard on 08/29/20    Time 4    Period Weeks    Status Not Met    Target Date 09/02/20      PT SHORT TERM GOAL #2   Title Pt will perform sit <> stand with supervision consistently with single UE support and with RW in order to improve functional LE  strength.    Baseline supervision and min guard when more fatigued 08/29/20; min guard initially and supervision for all other STS transfers 09/02/20    Time 4    Period Weeks    Status Partially Met      PT SHORT TERM GOAL #3   Title Pt will be able to stand for at least 5 minutes at RW with supervision in order to improve tolerance for ADLs.    Baseline 3 min 31 sec on 06/05/20 at RW CGA, 4 min and 30 seconds at [place] with supervision/min guard on 08/05/20    Time 4    Period Weeks    Status Partially Met      PT SHORT TERM GOAL #4   Title Pt will ambulate 96' with RW min guard for improved mobility in home with son's assist.    Baseline ambulated 78' with RW with min A on 08/05/20, ambulated 84' with RW and min A on 08/29/20, 72' with RW and supervision 09/02/20    Time 4    Period Weeks    Status Achieved             PT Long Term Goals - 08/29/20 1211      PT LONG TERM GOAL #1   Title Pt and pt's son/daughter will be independent in final HEP for LE strengthening/ROM. ALL LTGS 09/30/20    Baseline ongoing    Time 8    Period Weeks    Status On-going      PT LONG TERM GOAL #2   Title Pt will be able to perform stand pivot transfers with RW from w/c to mat table with supervision in order to decr caregiver burden    Baseline min guard with RW    Time 8    Period Weeks    Status New      PT LONG TERM GOAL #3   Title Pt will ambulate 11' with RW and min guard for improved mobility in home with son's assist.    Baseline 55' with min A on 08/05/20    Time 8    Period Weeks    Status Revised      PT LONG TERM GOAL #4   Title Pt will tolerate standing  activities at [facility] for at least 6 minutes in order to improve tolerance for ADLs.    Baseline 3 min 31 sec on 06/05/20    Time 8    Period Weeks    Status Revised                 Plan - 09/11/20 1203    Clinical Impression Statement Pt more fatigued today after scifit when performing alternating minutes between legs only and  BUE/BLE. Continued to work on gait training with incr distance and pt needing supervision initially during gait and min guard when more fatigued. Will continue to progress towards LTGs.    Personal Factors and Comorbidities Age;Past/Current Experience    Examination-Activity Limitations Bed Mobility;Stand;Transfers;Locomotion Level;Hygiene/Grooming;Bathing;Toileting    Examination-Participation Restrictions Community Activity;Cleaning;Meal Prep    Stability/Clinical Decision Making Evolving/Moderate complexity    Rehab Potential Fair    PT Frequency 2x / week   1-2x   PT Duration --   7-8 weeks   PT Treatment/Interventions Gait training;Therapeutic exercise;Neuromuscular re-education;Therapeutic activities;ADLs/Self Care Home Management;Patient/family education;Passive range of motion    PT Next Visit Plan Gait with RW. Will need w/c follow. Core/trunk/postural strengthening. SciFit in w/c, standing in RW. isometric hip ABD and quad strength. Repeated STS training.    Consulted and Agree with Plan of Care Patient;Family member/caregiver    Family Member Consulted pt's son Jeneen Rinks           Patient will benefit from skilled therapeutic intervention in order to improve the following deficits and impairments:  Decreased balance, Decreased activity tolerance, Decreased coordination, Decreased knowledge of use of DME, Decreased mobility, Decreased range of motion, Difficulty walking, Decreased strength, Impaired flexibility, Postural dysfunction, Impaired sensation, Pain  Visit Diagnosis: Muscle weakness (generalized)  Other abnormalities of gait and mobility  Unsteadiness on feet  Abnormal posture  Other symptoms and signs involving the nervous system     Problem List There are no problems to display for this patient.   Arliss Journey, PT, DPT  09/11/2020, 12:04 PM  [place] [address] [place], [place],  [address] Phone: [phone]   Fax:  [phone]  Name: Wayne Dudley MRN: [id number] Date of Birth: 08/09/1923

## 2020-09-16 ENCOUNTER — Ambulatory Visit: Payer: No Typology Code available for payment source | Admitting: Physical Therapy

## 2020-09-16 ENCOUNTER — Other Ambulatory Visit: Payer: Self-pay

## 2020-09-16 DIAGNOSIS — M6281 Muscle weakness (generalized): Secondary | ICD-10-CM

## 2020-09-16 DIAGNOSIS — R2681 Unsteadiness on feet: Secondary | ICD-10-CM

## 2020-09-16 DIAGNOSIS — R2689 Other abnormalities of gait and mobility: Secondary | ICD-10-CM

## 2020-09-16 DIAGNOSIS — R293 Abnormal posture: Secondary | ICD-10-CM

## 2020-09-16 NOTE — Therapy (Signed)
[facility] [address] [place], [place], [address] Phone: [phone]   Fax:  [phone]  Physical Therapy Treatment  Patient Details  Name: Wayne Dudley MRN: [id number] Date of Birth: 1923/08/24 Referring Provider (PT): Carrington Clamp, MD   Encounter Date: 09/16/2020   PT End of Session - 09/16/20 1406    Visit Number 43    Number of Visits 45    Date for PT Re-Evaluation 11/03/20   90 day cert, 60 day POC   Authorization Type VA- 15 visits approved, expires 12/03/2020    Authorization - Visit Number 13   miscounted previously - this is the correct number   Authorization - Number of Visits 15    PT Start Time 1104    PT Stop Time 1145    PT Time Calculation (min) 41 min    Equipment Utilized During Treatment Gait belt    Activity Tolerance Patient tolerated treatment well;Patient limited by fatigue    Behavior During Therapy [facility] for tasks assessed/performed           Past Medical History:  Diagnosis Date  . Constipation   . Hypertension     No past surgical history on file.  There were no vitals filed for this visit.   Subjective Assessment - 09/16/20 1106    Subjective No changes since he was last here. Walking is going well at home.    Pertinent History footpain 2/2 to frostbite injury, CKD, hard of hearing.    How long can you stand comfortably? can stand for about 1-2 minutes before having pain (in the feet and left ankle)    Diagnostic tests MRI of lumbar spine 11/10/19: multilevel degenerative changes, severe spinal canal stenosis at L3-L4 and L5-S1    Patient Stated Goals wants to get back to where he can do for himself.    Currently in Pain? Yes    Pain Score 7     Pain Location Foot    Pain Orientation Left    Pain Descriptors / Indicators Burning    Pain Type Chronic pain    Pain Onset More than a month ago    Aggravating Factors  cold    Pain Relieving Factors tylenol                              [facility] Adult PT Treatment/Exercise - 09/16/20 1117      Transfers   Transfers Sit to Stand;Stand to Sit    Sit to Stand 4: Min guard;5: Supervision    Stand to Sit 5: Supervision    Stand Pivot Transfers 5: Supervision    Stand Pivot Transfer Details (indicate cue type and reason) from w/c > mat table    Comments x5 reps  sit <> stands from mat table with single UE on mat table and other on RW, cues for standing and sitting right back down to mat table      Ambulation/Gait   Ambulation/Gait Yes    Ambulation/Gait Assistance 4: Min guard;5: Supervision    Ambulation/Gait Assistance Details w/c follow for rest breaks due to  fatigue. pt only needing min guard when going around turns, cues for posture throughout. performed during the 2nd half of the session after standing activities     Ambulation Distance (Feet) 35 Feet   x1,50 x1, 67 x1   Assistive device Rolling walker    Gait Pattern Step-through pattern;Left flexed knee  in stance    Ambulation Surface Level;Indoor      High Level Balance   High Level Balance Comments standing at edge of mat table in RW: foot taps to 2" step for incr foot clearance and hip/knee flexion 2 x 5 reps B, pt with incr difficulty at times lifting up LLE, needed seated rest break between each bout      Exercises   Exercises Other Exercises    Other Exercises  standing at edge of mat wit RW: 2 x5 reps mini squats, verbal,demo, and tactile cues for technique and posture with quad activation in standing. needing seated rest break between each      Knee/Hip Exercises: Stretches   Other Knee/Hip Stretches hamstring/ankle DF stretch with LLE stretched out on therapist's thigh 3 x 30 seconds                    PT Short Term Goals - 09/02/20 1540      PT SHORT TERM GOAL #1   Title Pt will be able to perform stand pivot transfers with supervision to and from mat table with RW in order to decr caregiver burden. ALL  STGS DUE 09/02/20    Baseline performing stand pivot with RW with min guard on 08/29/20    Time 4    Period Weeks    Status Not Met    Target Date 09/02/20      PT SHORT TERM GOAL #2   Title Pt will perform sit <> stand with supervision consistently with single UE support and with RW in order to improve functional LE strength.    Baseline supervision and min guard when more fatigued 08/29/20; min guard initially and supervision for all other STS transfers 09/02/20    Time 4    Period Weeks    Status Partially Met      PT SHORT TERM GOAL #3   Title Pt will be able to stand for at least 5 minutes at RW with supervision in order to improve tolerance for ADLs.    Baseline 3 min 31 sec on 06/05/20 at RW CGA, 4 min and 30 seconds at [place] with supervision/min guard on 08/05/20    Time 4    Period Weeks    Status Partially Met      PT SHORT TERM GOAL #4   Title Pt will ambulate 9' with RW min guard for improved mobility in home with son's assist.    Baseline ambulated 61' with RW with min A on 08/05/20, ambulated 44' with RW and min A on 08/29/20, 77' with RW and supervision 09/02/20    Time 4    Period Weeks    Status Achieved             PT Long Term Goals - 08/29/20 1211      PT LONG TERM GOAL #1   Title Pt and pt's son/daughter will be independent in final HEP for LE strengthening/ROM. ALL LTGS 09/30/20    Baseline ongoing    Time 8    Period Weeks    Status On-going      PT LONG TERM GOAL #2   Title Pt will be able to perform stand pivot transfers with RW from w/c to mat table with supervision in order to decr caregiver burden    Baseline min guard with RW    Time 8    Period Weeks    Status New      PT LONG  TERM GOAL #3   Title Pt will ambulate 76' with RW and min guard for improved mobility in home with son's assist.    Baseline 31' with min A on 08/05/20    Time 8    Period Weeks    Status Revised      PT LONG TERM GOAL #4   Title Pt will tolerate standing activities  at [place] for at least 6 minutes in order to improve tolerance for ADLs.    Baseline 3 min 31 sec on 06/05/20    Time 8    Period Weeks    Status Revised                 Plan - 09/16/20 1417    Clinical Impression Statement Pt able to ambulate a total of 152' with RW in 3 separate bouts today at end of the session after standing activity. Pt demonstrating improved foot clearance and only needing min guard intermittently, otherwise able to perform with close supervision.    Personal Factors and Comorbidities Age;Past/Current Experience    Examination-Activity Limitations Bed Mobility;Stand;Transfers;Locomotion Level;Hygiene/Grooming;Bathing;Toileting    Examination-Participation Restrictions Community Activity;Cleaning;Meal Prep    Stability/Clinical Decision Making Evolving/Moderate complexity    Rehab Potential Fair    PT Frequency 2x / week   1-2x   PT Duration --   7-8 weeks   PT Treatment/Interventions Gait training;Therapeutic exercise;Neuromuscular re-education;Therapeutic activities;ADLs/Self Care Home Management;Patient/family education;Passive range of motion    PT Next Visit Plan pt with 2 more appts - continue gait and begin checking LTGs and finalizing HEP with anticipated D/C.    Consulted and Agree with Plan of Care Patient;Family member/caregiver    Family Member Consulted pt's son Jeneen Rinks           Patient will benefit from skilled therapeutic intervention in order to improve the following deficits and impairments:  Decreased balance, Decreased activity tolerance, Decreased coordination, Decreased knowledge of use of DME, Decreased mobility, Decreased range of motion, Difficulty walking, Decreased strength, Impaired flexibility, Postural dysfunction, Impaired sensation, Pain  Visit Diagnosis: Muscle weakness (generalized)  Other abnormalities of gait and mobility  Unsteadiness on feet  Abnormal posture     Problem List There are no problems to display for this  patient.   Arliss Journey, PT, DPT  09/16/2020, 2:20 PM  [place] [address] [place], [place], [address] Phone: [phone]   Fax:  [phone]  Name: Wayne Dudley MRN: [id number] Date of Birth: 04/18/1923

## 2020-09-19 ENCOUNTER — Other Ambulatory Visit: Payer: Self-pay

## 2020-09-19 ENCOUNTER — Ambulatory Visit: Payer: No Typology Code available for payment source

## 2020-09-19 DIAGNOSIS — R2681 Unsteadiness on feet: Secondary | ICD-10-CM

## 2020-09-19 DIAGNOSIS — R2689 Other abnormalities of gait and mobility: Secondary | ICD-10-CM

## 2020-09-19 DIAGNOSIS — M6281 Muscle weakness (generalized): Secondary | ICD-10-CM

## 2020-09-19 NOTE — Therapy (Signed)
[place] [address] [place], [place], [address] Phone: [phone]   Fax:  [phone]  Physical Therapy Treatment  Patient Details  Name: Wayne Dudley MRN: [id number] Date of Birth: [birth year] Referring Provider (PT): [name], MD   Encounter Date: 09/19/2020   PT End of Session - 09/19/20 1155    Visit Number 44    Number of Visits 45    Date for PT Re-Evaluation 37/62/83   90 day cert, 60 day POC   Authorization Type VA- 15 visits approved, expires 12/03/2020    Authorization - Visit Number 14   miscounted previously - this is the correct number   Authorization - Number of Visits 15    PT Start Time 1104    PT Stop Time 1145    PT Time Calculation (min) 41 min    Equipment Utilized During Treatment Gait belt    Activity Tolerance Patient tolerated treatment well;Patient limited by fatigue    Behavior During Therapy [facility] for tasks assessed/performed           Past Medical History:  Diagnosis Date  . Constipation   . Hypertension     History reviewed. No pertinent surgical history.  There were no vitals filed for this visit.   Subjective Assessment - 09/19/20 1104    Subjective Walking and HEP is going well at home. No new changes.    Pertinent History footpain 2/2 to frostbite injury, CKD, hard of hearing.    How long can you stand comfortably? can stand for about 1-2 minutes before having pain (in the feet and left ankle)    Diagnostic tests MRI of lumbar spine 11/10/19: multilevel degenerative changes, severe spinal canal stenosis at L3-L4 and L5-S1    Patient Stated Goals wants to get back to where he can do for himself.    Currently in Pain? Yes    Pain Score 7     Pain Location Foot    Pain Orientation Left    Pain Onset More than a month ago                             [facility] Adult PT Treatment/Exercise - 09/19/20 1105      Transfers   Transfers Sit to  Stand;Stand to Sit    Sit to Stand 4: Min guard;5: Supervision    Stand to Sit 5: Supervision    Stand Pivot Transfers 5: Supervision    Stand Pivot Transfer Details (indicate cue type and reason) w/c <> mat with slight difficulty turning and managing RW.      Ambulation/Gait   Ambulation/Gait Yes    Ambulation/Gait Assistance 4: Min guard;5: Supervision    Ambulation/Gait Assistance Details W/C follow by son with PT giving verbal cues for larger L step and picking up RLE more for improved foot clearance.    Ambulation Distance (Feet) 47 Feet   47' x 1st trial, 25' x 2nd trial, 42' x 3rd trial, 38' x 4th   Assistive device Rolling walker    Gait Pattern Step-through pattern;Left flexed knee in stance    Ambulation Surface Level;Indoor      High Level Balance   High Level Balance Comments standing at RW in front of W/C for 3 minutes for reaching for targets in multiple planes with RUE.                  PT Education -  09/19/20 1154    Education Details Pt educated on POC and progress towards goals. Pt and son understand that pt will D/C next visit.    Person(s) Educated Patient;Child(ren)    Methods Explanation    Comprehension Verbalized understanding            PT Short Term Goals - 09/02/20 1540      PT SHORT TERM GOAL #1   Title Pt will be able to perform stand pivot transfers with supervision to and from mat table with RW in order to decr caregiver burden. ALL STGS DUE 09/02/20    Baseline performing stand pivot with RW with min guard on 08/29/20    Time 4    Period Weeks    Status Not Met    Target Date 09/02/20      PT SHORT TERM GOAL #2   Title Pt will perform sit <> stand with supervision consistently with single UE support and with RW in order to improve functional LE strength.    Baseline supervision and min guard when more fatigued 08/29/20; min guard initially and supervision for all other STS transfers 09/02/20    Time 4    Period Weeks    Status  Partially Met      PT SHORT TERM GOAL #3   Title Pt will be able to stand for at least 5 minutes at RW with supervision in order to improve tolerance for ADLs.    Baseline 3 min 31 sec on 06/05/20 at RW CGA, 4 min and 30 seconds at [place] with supervision/min guard on 08/05/20    Time 4    Period Weeks    Status Partially Met      PT SHORT TERM GOAL #4   Title Pt will ambulate 82' with RW min guard for improved mobility in home with son's assist.    Baseline ambulated 4' with RW with min A on 08/05/20, ambulated 25' with RW and min A on 08/29/20, 59' with RW and supervision 09/02/20    Time 4    Period Weeks    Status Achieved             PT Long Term Goals - 09/19/20 1205      PT LONG TERM GOAL #1   Title Pt and pt's son/daughter will be independent in final HEP for LE strengthening/ROM. ALL LTGS 09/30/20    Baseline ongoing    Time 8    Period Weeks    Status On-going      PT LONG TERM GOAL #2   Title Pt will be able to perform stand pivot transfers with RW from w/c to mat table with supervision in order to decr caregiver burden    Baseline Supervision with W/C <> mat with RW 09/19/20    Time 8    Period Weeks    Status Achieved      PT LONG TERM GOAL #3   Title Pt will ambulate 42' with RW and min guard for improved mobility in home with son's assist.    Baseline 72' with min A on 08/05/20; 69' with CGA 09/19/20    Time 8    Period Weeks    Status Not Met      PT LONG TERM GOAL #4   Title Pt will tolerate standing activities at RW for at least 6 minutes in order to improve tolerance for ADLs.    Baseline 3 min 31 sec on 06/05/20    Time 8  Period Weeks    Status Revised                 Plan - 09/19/20 1155    Clinical Impression Statement Pt able to ambulate a total 162' with RW in 4 separate bouts today after transferring from W/C <> mat table with close supervision. Pt is beginning to plateau in terms of progress. PT and pt/family agreeable to D/C next visit.     Personal Factors and Comorbidities Age;Past/Current Experience    Examination-Activity Limitations Bed Mobility;Stand;Transfers;Locomotion Level;Hygiene/Grooming;Bathing;Toileting    Examination-Participation Restrictions Community Activity;Cleaning;Meal Prep    Stability/Clinical Decision Making Evolving/Moderate complexity    Rehab Potential Fair    PT Frequency 2x / week   1-2x   PT Duration --   7-8 weeks   PT Treatment/Interventions Gait training;Therapeutic exercise;Neuromuscular re-education;Therapeutic activities;ADLs/Self Care Home Management;Patient/family education;Passive range of motion    PT Next Visit Plan Check LTGs and finalizing HEP for D/C.    Consulted and Agree with Plan of Care Patient;Family member/caregiver    Family Member Consulted pt's son Jeneen Rinks           Patient will benefit from skilled therapeutic intervention in order to improve the following deficits and impairments:  Decreased balance, Decreased activity tolerance, Decreased coordination, Decreased knowledge of use of DME, Decreased mobility, Decreased range of motion, Difficulty walking, Decreased strength, Impaired flexibility, Postural dysfunction, Impaired sensation, Pain  Visit Diagnosis: Muscle weakness (generalized)  Other abnormalities of gait and mobility  Unsteadiness on feet     Problem List There are no problems to display for this patient.   Rosalita Levan, SPT 09/19/2020, 1:00 PM  [place] [address] [place], [place], [address] Phone: [phone]   Fax:  [phone]  Name: Wayne Dudley MRN: [id number] Date of Birth: 1923-06-09

## 2020-09-23 ENCOUNTER — Other Ambulatory Visit: Payer: Self-pay

## 2020-09-23 ENCOUNTER — Encounter: Payer: Self-pay | Admitting: Physical Therapy

## 2020-09-23 ENCOUNTER — Ambulatory Visit: Payer: No Typology Code available for payment source | Admitting: Physical Therapy

## 2020-09-23 DIAGNOSIS — R2689 Other abnormalities of gait and mobility: Secondary | ICD-10-CM | POA: Diagnosis not present

## 2020-09-23 DIAGNOSIS — R2681 Unsteadiness on feet: Secondary | ICD-10-CM

## 2020-09-23 DIAGNOSIS — M6281 Muscle weakness (generalized): Secondary | ICD-10-CM

## 2020-09-23 DIAGNOSIS — R293 Abnormal posture: Secondary | ICD-10-CM

## 2020-09-23 NOTE — Therapy (Signed)
[facility] [address] [place], [place], [address] Phone: [phone]   Fax:  [phone]  Physical Therapy Treatment/Discharge Summary  Patient Details  Name: Wayne Dudley MRN: [id number] Date of Birth: 07/26/1923 Referring Provider (PT): [name], MD   Encounter Date: 09/23/2020   PT End of Session - 09/23/20 1253    Visit Number 45    Number of Visits 45    Date for PT Re-Evaluation 16/60/63   90 day cert, 60 day POC   Authorization Type [facility]- 15 visits approved, expires 12/03/2020    Authorization - Visit Number 15    Authorization - Number of Visits 15    PT Start Time 1102    PT Stop Time 1144    PT Time Calculation (min) 42 min    Equipment Utilized During Treatment Gait belt    Activity Tolerance Patient tolerated treatment well;Patient limited by fatigue    Behavior During Therapy [facility] for tasks assessed/performed           Past Medical History:  Diagnosis Date  . Constipation   . Hypertension     History reviewed. No pertinent surgical history.  There were no vitals filed for this visit.   Subjective Assessment - 09/23/20 1104    Subjective No changes.    Pertinent History footpain 2/2 to frostbite injury, CKD, hard of hearing.    How long can you stand comfortably? can stand for about 1-2 minutes before having pain (in the feet and left ankle)    Diagnostic tests MRI of lumbar spine 11/10/19: multilevel degenerative changes, severe spinal canal stenosis at L3-L4 and L5-S1    Patient Stated Goals wants to get back to where he can do for himself.    Currently in Pain? Yes    Pain Score 7     Pain Location Foot    Pain Orientation Left    Pain Descriptors / Indicators Burning    Pain Type Chronic pain    Pain Onset More than a month ago    Aggravating Factors  cold    Pain Relieving Factors tylenol                             [facility] Adult PT Treatment/Exercise -  09/23/20 0001      Bed Mobility   Supine to Sit Supervision/Verbal cueing    Sit to Supine Supervision/Verbal cueing      Transfers   Transfers Sit to Stand;Stand to Sit    Sit to Stand 4: Min guard;5: Supervision    Stand to Sit 5: Supervision    Stand Pivot Transfers 5: Supervision    Stand Pivot Transfer Details (indicate cue type and reason) from w/c > mat table      Ambulation/Gait   Ambulation/Gait Yes    Ambulation/Gait Assistance 4: Min guard;5: Supervision    Ambulation/Gait Assistance Details w/c follow, cues for posture    Ambulation Distance (Feet) 45 Feet   x2   Assistive device Rolling walker    Gait Pattern Step-through pattern;Left flexed knee in stance    Ambulation Surface Level;Indoor      Exercises   Exercises Other Exercises    Other Exercises  performed modified thomas test hip flexor stretch B 2 x60 seconds             Access Code: [email or web address] URL: [email or web address] Date: 09/23/2020 Prepared by: Janann August  Reviewed finalized HEP -  see MedBridge for more details. Pt needing cues at times to perform exercises more slowly, due to pt tendency to perform too quickly.   Exercises Seated Hip Abduction with Resistance - 2 x daily - 5 x weekly - 2 sets - 10 reps Seated Hip Adduction Isometrics with Ball - 2 x daily - 5 x weekly - 2 sets - 10 reps Seated Heel Slide - 2 x daily - 5 x weekly - 2 sets - 10 reps Supine Ankle Dorsiflexion Stretch with Caregiver - 2 x daily - 5 x weekly - 3 sets - 20 hold Seated Scapular Retraction - 1-2 x daily - 5 x weekly - 2 sets - 10 reps Sitting Knee Extension with Resistance - 1 x daily - 5 x weekly - 2 sets - 5 reps Seated Hamstring Stretch - 3 x daily - 7 x weekly - 1 sets - 4 reps - 30 sec hold Supine Hip Flexor Stretch with Weight - 3 x daily - 7 x weekly - 1 sets - 4 reps - 30 sec hold Seated Gluteal Sets - 1 x daily - 7 x weekly - 3 sets - 10 reps        PT Education - 09/23/20 1253     Education Details finalized HEP    Person(s) Educated Patient;Child(ren)    Methods Explanation;Demonstration;Verbal cues    Comprehension Verbalized understanding;Returned demonstration            PT Short Term Goals - 09/02/20 1540      PT SHORT TERM GOAL #1   Title Pt will be able to perform stand pivot transfers with supervision to and from mat table with RW in order to decr caregiver burden. ALL STGS DUE 09/02/20    Baseline performing stand pivot with RW with min guard on 08/29/20    Time 4    Period Weeks    Status Not Met    Target Date 09/02/20      PT SHORT TERM GOAL #2   Title Pt will perform sit <> stand with supervision consistently with single UE support and with RW in order to improve functional LE strength.    Baseline supervision and min guard when more fatigued 08/29/20; min guard initially and supervision for all other STS transfers 09/02/20    Time 4    Period Weeks    Status Partially Met      PT SHORT TERM GOAL #3   Title Pt will be able to stand for at least 5 minutes at RW with supervision in order to improve tolerance for ADLs.    Baseline 3 min 31 sec on 06/05/20 at RW CGA, 4 min and 30 seconds at [place] with supervision/min guard on 08/05/20    Time 4    Period Weeks    Status Partially Met      PT SHORT TERM GOAL #4   Title Pt will ambulate 104' with RW min guard for improved mobility in home with son's assist.    Baseline ambulated 39' with RW with min A on 08/05/20, ambulated 72' with RW and min A on 08/29/20, 56' with RW and supervision 09/02/20    Time 4    Period Weeks    Status Achieved             PT Long Term Goals - 09/23/20 1254      PT LONG TERM GOAL #1   Title Pt and pt's son/daughter will be independent in final HEP for  LE strengthening/ROM. ALL LTGS 09/30/20    Baseline revieed on 09/23/20 - needed review of certain exercises.    Time 8    Period Weeks    Status Achieved      PT LONG TERM GOAL #2   Title Pt will be able to perform  stand pivot transfers with RW from w/c to mat table with supervision in order to decr caregiver burden    Baseline Supervision with W/C <> mat with RW 09/19/20    Time 8    Period Weeks    Status Achieved      PT LONG TERM GOAL #3   Title Pt will ambulate 16' with RW and min guard for improved mobility in home with son's assist.    Baseline 70' with min A on 08/05/20; 52' with CGA 09/19/20    Time 8    Period Weeks    Status Not Met      PT LONG TERM GOAL #4   Title Pt will tolerate standing activities at RW for at least 6 minutes in order to improve tolerance for ADLs.    Baseline 3 min 31 sec on 06/05/20    Time 8    Period Weeks    Status Revised           PHYSICAL THERAPY DISCHARGE SUMMARY  Visits from Start of Care: 45  Current functional level related to goals / functional outcomes: See LTGs   Remaining deficits: decr BLE strength, gait abnormalities, decr endurance, impaired balance, postural abnormalities.    Education / Equipment: HEP   Plan: Patient agrees to discharge.  Patient goals were met. Patient is being discharged due to meeting the stated rehab goals.  ?????            Plan - 09/23/20 1259    Clinical Impression Statement Focus of today's skilled session was reviewing pt's HEP - pt needing cues at times to slow down and needing reminders of certain exercises as pt reports it has been a while since he has performed them. Pt is pleased with his progress with PT and is agreeable to D/C at this time    Personal Factors and Comorbidities Age;Past/Current Experience    Examination-Activity Limitations Bed Mobility;Stand;Transfers;Locomotion Level;Hygiene/Grooming;Bathing;Toileting    Examination-Participation Restrictions Community Activity;Cleaning;Meal Prep    Stability/Clinical Decision Making Evolving/Moderate complexity    Rehab Potential Fair    PT Frequency 2x / week   1-2x   PT Duration --   7-8 weeks   PT Treatment/Interventions Gait  training;Therapeutic exercise;Neuromuscular re-education;Therapeutic activities;ADLs/Self Care Home Management;Patient/family education;Passive range of motion    PT Next Visit Plan D/C    Consulted and Agree with Plan of Care Patient;Family member/caregiver    Family Member Consulted pt's son Jeneen Rinks           Patient will benefit from skilled therapeutic intervention in order to improve the following deficits and impairments:  Decreased balance, Decreased activity tolerance, Decreased coordination, Decreased knowledge of use of DME, Decreased mobility, Decreased range of motion, Difficulty walking, Decreased strength, Impaired flexibility, Postural dysfunction, Impaired sensation, Pain  Visit Diagnosis: Other abnormalities of gait and mobility  Muscle weakness (generalized)  Unsteadiness on feet  Abnormal posture     Problem List There are no problems to display for this patient.   Arliss Journey, PT, DPT  09/23/2020, 1:00 PM  [facility] [address] [place], [place], [address] Phone: [phone]   Fax:  [phone]  Name: Wayne Dudley MRN: [id number] Date of Birth: 11-Aug-1923

## 2021-03-12 ENCOUNTER — Emergency Department (HOSPITAL_BASED_OUTPATIENT_CLINIC_OR_DEPARTMENT_OTHER)
Admission: EM | Admit: 2021-03-12 | Discharge: 2021-03-12 | Disposition: A | Discharge status: Home / Self Care | Payer: No Typology Code available for payment source | Attending: Emergency Medicine | Admitting: Emergency Medicine

## 2021-03-12 ENCOUNTER — Emergency Department (HOSPITAL_BASED_OUTPATIENT_CLINIC_OR_DEPARTMENT_OTHER): Payer: No Typology Code available for payment source

## 2021-03-12 ENCOUNTER — Encounter (HOSPITAL_BASED_OUTPATIENT_CLINIC_OR_DEPARTMENT_OTHER): Payer: Self-pay

## 2021-03-12 ENCOUNTER — Other Ambulatory Visit: Payer: Self-pay

## 2021-03-12 DIAGNOSIS — Z7982 Long term (current) use of aspirin: Secondary | ICD-10-CM | POA: Diagnosis not present

## 2021-03-12 DIAGNOSIS — M7989 Other specified soft tissue disorders: Secondary | ICD-10-CM | POA: Diagnosis not present

## 2021-03-12 DIAGNOSIS — R600 Localized edema: Secondary | ICD-10-CM | POA: Diagnosis not present

## 2021-03-12 DIAGNOSIS — I10 Essential (primary) hypertension: Secondary | ICD-10-CM | POA: Diagnosis not present

## 2021-03-12 DIAGNOSIS — Z79899 Other long term (current) drug therapy: Secondary | ICD-10-CM | POA: Diagnosis not present

## 2021-03-12 NOTE — ED Notes (Signed)
Pt transported to [facility]

## 2021-03-12 NOTE — ED Provider Notes (Signed)
[facility] EMERGENCY DEPARTMENT Provider Note   CSN: [id number] Arrival date & time: 03/12/21  0930     History Chief Complaint  Patient presents with  . Wound Check    Wayne Dudley is a 87 y.o. male.  Patient is a 87 year old male with a history of constipation, hypertension who was seen at the [place] presenting today with pain in the left ankle and some mild swelling.  Patient's son gives the majority of the history as the patient is hard of hearing.  He reports that approximately 1 week ago they noticed some bruising and irritation of the skin to the patient's left lateral ankle.  They have been treating it with home remedies and it appeared to be healing but patient still seems to have discomfort with putting weight on the leg and today they noticed that the foot was swollen.  Patient does report pain with palpation of the heel and the ankle on that side.  He is otherwise been acting his normal self.  He has had no hallucinations, weakness, poor oral intake.  There is been no fever, cough or shortness of breath.  Patient is mostly wheelchair-bound but no prior history of PE or DVT.  The history is provided by the patient, a relative and a caregiver.  Wound Check       Past Medical History:  Diagnosis Date  . Constipation   . Hypertension     There are no problems to display for this patient.   Past Surgical History:  Procedure Laterality Date  . BACK SURGERY         History reviewed. No pertinent family history.  Social History   Tobacco Use  . Smoking status: Never Smoker  . Smokeless tobacco: Never Used  Vaping Use  . Vaping Use: Never used  Substance Use Topics  . Alcohol use: Never  . Drug use: Never    Home Medications Prior to Admission medications   Medication Sig Start Date End Date Taking? Authorizing Provider  acetaminophen (TYLENOL) 325 MG tablet Take 325 mg by mouth every 6 (six) hours as needed. Taking 2 every 8-9 hours. Getting  6/day total per son   Yes [provider]  aspirin 81 MG chewable tablet Chew by mouth daily.   Yes [provider]  hydrochlorothiazide (HYDRODIURIL) 25 MG tablet Take 25 mg by mouth daily.   Yes [provider]  nortriptyline (PAMELOR) 75 MG capsule Take 75 mg by mouth at bedtime.   Yes [provider]  sennosides-docusate sodium (SENOKOT-S) 8.6-50 MG tablet Take 1 tablet by mouth daily. As needed   Yes [provider]  acetaminophen (TYLENOL) 500 MG tablet Take 500 mg by mouth every 6 (six) hours as needed.    [provider]    Allergies    Gabapentin and Prednisone  Review of Systems   Review of Systems  All other systems reviewed and are negative.   Physical Exam Updated Vital Signs BP (!) 146/50 (BP Location: Right Arm)   Pulse 72   Temp 98.2 F (36.8 C) (Oral)   Resp 18   Ht 5\' 9"  (1.753 m)   Wt 73 kg   SpO2 98%   BMI 23.78 kg/m   Physical Exam Vitals and nursing note reviewed.  Constitutional:      General: He is not in acute distress.    Appearance: He is well-developed.  HENT:     Head: Normocephalic and atraumatic.  Eyes:  Conjunctiva/sclera: Conjunctivae normal.     Pupils: Pupils are equal, round, and reactive to light.  Cardiovascular:     Rate and Rhythm: Normal rate and regular rhythm.     Heart sounds: No murmur heard.   Pulmonary:     Effort: Pulmonary effort is normal. No respiratory distress.     Breath sounds: Normal breath sounds. No wheezing or rales.  Abdominal:     General: There is no distension.     Palpations: Abdomen is soft.     Tenderness: There is no abdominal tenderness. There is no guarding or rebound.  Musculoskeletal:        General: No tenderness. Normal range of motion.     Cervical back: Normal range of motion and neck supple.     Right lower leg: Edema present.     Left lower leg: Edema present.     Comments: 1+ pitting edema bilateral ankles.  Small healing wound  on the left ankle that is tender to the touch but has no erythema, induration or drainage.  Bilateral feet are pink and warm with normal capillary refill.  Patient does have some mild left calf tenderness.  Plain film  Skin:    General: Skin is warm and dry.     Findings: No erythema or rash.  Neurological:     Mental Status: He is alert and oriented to person, place, and time. Mental status is at baseline.  Psychiatric:        Mood and Affect: Mood normal.        Behavior: Behavior normal.     ED Results / Procedures / Treatments   Labs (all labs ordered are listed, but only abnormal results are displayed) Labs Reviewed - No data to display  EKG None  Radiology DG Ankle Complete Left  Result Date: 03/12/2021 CLINICAL DATA:  Left ankle pain and swelling. EXAM: LEFT ANKLE COMPLETE - 3+ VIEW COMPARISON:  None. FINDINGS: There is no evidence of fracture, dislocation, or joint effusion. There is a small posterior calcaneal enthesophyte. There is mild soft tissue swelling overlying the lateral malleolus. IMPRESSION: No acute osseous injury. Electronically Signed   By: Romona Curls M.D.   On: 03/12/2021 10:45   US Venous Img Lower  Left (DVT Study)  Result Date: 03/12/2021 CLINICAL DATA:  Pain and swelling LEFT lower extremity EXAM: LEFT LOWER EXTREMITY VENOUS DOPPLER ULTRASOUND TECHNIQUE: Gray-scale sonography with compression, as well as color and duplex ultrasound, were performed to evaluate the deep venous system(s) from the level of the common femoral vein through the popliteal and proximal calf veins. COMPARISON:  None FINDINGS: VENOUS Normal compressibility of the common femoral, superficial femoral, and popliteal veins, as well as the visualized calf veins. Visualized portions of profunda femoral vein and great saphenous vein unremarkable. No filling defects to suggest DVT on grayscale or color Doppler imaging. Doppler waveforms show normal direction of venous flow, normal respiratory  plasticity and response to augmentation. Limited views of the contralateral common femoral vein are unremarkable. OTHER Scattered subcutaneous edema LEFT calf. Limitations: none IMPRESSION: No evidence of deep venous thrombosis in the LEFT lower extremity. Subcutaneous edema LEFT calf. Electronically Signed   By: Ulyses Southward M.D.   On: 03/12/2021 12:50    Procedures Procedures   Medications Ordered in ED Medications - No data to display  ED Course  I have reviewed the triage vital signs and the nursing notes.  Pertinent labs & imaging results that were available during my care of  the patient were reviewed by me and considered in my medical decision making (see chart for details).    MDM Rules/Calculators/A&P                          Elderly male who is wheelchair-bound presenting today with pain in his left lateral ankle and some mild swelling.  Family denied any trauma but possibility that patient hit the ankle on his wheelchair or when he was sleeping.  There is a small healing wound that does not appear to have any signs of infection today.  He does have some mild edema in bilateral lower extremities just to above the ankle but most likely venous stasis as he is in a dependent position.  Patient is pretty tender with palpation.  We will do an x-ray to ensure no fracture.  If that is normal we will do ultrasound to rule out DVT.  Patient is not having other symptoms.  He has not been febrile or having any chest pain or shortness of breath.  10:53 AM Plain films are neg will get dvt study to ensure no clot.  12:58 PM U/s neg for acute dvt but does show edema.  Will have pt use compression socks.  No signs of infection at this time that would warrant abx.  Will d/c home ot use compression and [facility] not [facility] or return if worsening and develops infectious sx.  MDM Number of Diagnoses or Management Options   Amount and/or Complexity of Data Reviewed Tests in the radiology  section of CPT: ordered and reviewed Independent visualization of images, tracings, or specimens: yes     Final Clinical Impression(s) / ED Diagnoses Final diagnoses:  Bilateral lower extremity edema    Rx / DC Orders ED Discharge Orders    None       Gwyneth Sprout, MD 03/12/21 1301

## 2021-03-12 NOTE — Discharge Instructions (Signed)
Start using the compression socks during the day while he is up to see if that helps with the swelling.  The x-ray and ultrasound were normal today with normal bones and no blood clots.  No signs of infection over the wound.  If it starts to get red or starts draining or he develops fever or other symptoms return to the ER.
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
## 2022-03-03 ENCOUNTER — Emergency Department (HOSPITAL_BASED_OUTPATIENT_CLINIC_OR_DEPARTMENT_OTHER): Payer: No Typology Code available for payment source | Admitting: Radiology

## 2022-03-03 ENCOUNTER — Encounter (HOSPITAL_BASED_OUTPATIENT_CLINIC_OR_DEPARTMENT_OTHER): Payer: Self-pay | Admitting: Emergency Medicine

## 2022-03-03 ENCOUNTER — Other Ambulatory Visit: Payer: Self-pay

## 2022-03-03 ENCOUNTER — Emergency Department (HOSPITAL_BASED_OUTPATIENT_CLINIC_OR_DEPARTMENT_OTHER)
Admission: EM | Admit: 2022-03-03 | Discharge: 2022-03-03 | Disposition: A | Discharge status: Home / Self Care | Payer: No Typology Code available for payment source | Attending: Emergency Medicine | Admitting: Emergency Medicine

## 2022-03-03 DIAGNOSIS — Z79899 Other long term (current) drug therapy: Secondary | ICD-10-CM | POA: Insufficient documentation

## 2022-03-03 DIAGNOSIS — I10 Essential (primary) hypertension: Secondary | ICD-10-CM | POA: Diagnosis not present

## 2022-03-03 DIAGNOSIS — R0902 Hypoxemia: Secondary | ICD-10-CM | POA: Diagnosis present

## 2022-03-03 DIAGNOSIS — R0989 Other specified symptoms and signs involving the circulatory and respiratory systems: Secondary | ICD-10-CM

## 2022-03-03 DIAGNOSIS — D649 Anemia, unspecified: Secondary | ICD-10-CM | POA: Diagnosis not present

## 2022-03-03 DIAGNOSIS — Z7982 Long term (current) use of aspirin: Secondary | ICD-10-CM | POA: Insufficient documentation

## 2022-03-03 LAB — BASIC METABOLIC PANEL
Anion gap: 10 (ref 5–15)
BUN: 16 mg/dL (ref 8–23)
CO2: 32 mmol/L (ref 22–32)
Calcium: 9.9 mg/dL (ref 8.9–10.3)
Chloride: 90 mmol/L — ABNORMAL LOW (ref 98–111)
Creatinine, Ser: 1.14 mg/dL (ref 0.61–1.24)
GFR, Estimated: 59 mL/min — ABNORMAL LOW (ref >60)
Glucose, Bld: 108 mg/dL — ABNORMAL HIGH (ref 70–99)
Potassium: 3.5 mmol/L (ref 3.5–5.1)
Sodium: 132 mmol/L — ABNORMAL LOW (ref 135–145)

## 2022-03-03 LAB — CBC WITH DIFFERENTIAL/PLATELET
Abs Immature Granulocytes: 0.02 10*3/uL (ref 0.00–0.07)
Basophils Absolute: 0 10*3/uL (ref 0.0–0.1)
Basophils Relative: 0 %
Eosinophils Absolute: 0 10*3/uL (ref 0.0–0.5)
Eosinophils Relative: 0 %
HCT: 39.9 % (ref 39.0–52.0)
Hemoglobin: 12.6 g/dL — ABNORMAL LOW (ref 13.0–17.0)
Immature Granulocytes: 0 %
Lymphocytes Relative: 17 %
Lymphs Abs: 0.9 10*3/uL (ref 0.7–4.0)
MCH: 27.9 pg (ref 26.0–34.0)
MCHC: 31.6 g/dL (ref 30.0–36.0)
MCV: 88.5 fL (ref 80.0–100.0)
Monocytes Absolute: 0.5 10*3/uL (ref 0.1–1.0)
Monocytes Relative: 10 %
Neutro Abs: 3.9 10*3/uL (ref 1.7–7.7)
Neutrophils Relative %: 73 %
Platelets: 281 10*3/uL (ref 150–400)
RBC: 4.51 MIL/uL (ref 4.22–5.81)
RDW: 14.8 % (ref 11.5–15.5)
WBC: 5.3 10*3/uL (ref 4.0–10.5)
nRBC: 0 % (ref 0.0–0.2)

## 2022-03-03 MED ORDER — SODIUM CHLORIDE 0.9 % IV BOLUS
500.0000 mL | Freq: Once | INTRAVENOUS | Status: AC
  Administered 2022-03-03: 500 mL via INTRAVENOUS

## 2022-03-03 NOTE — Discharge Instructions (Signed)
The O2 sats in the ER have been consistently over 95%. ?You are not having any concerning symptoms such as severe chest pain, shortness of breath, near fainting or fainting spells, which is reassuring. ? ?Blood count is also normal here, which is reassuring. ? ?Possibly the pulse ox reading at home or erroneous -as a sensor sometimes are faulty on certain people. ?Return to the ER if you start having severe chest pain, shortness of breath severe dizziness or near fainting. ?

## 2022-03-03 NOTE — ED Provider Notes (Addendum)
?[facility] EMERGENCY DEPT ?Provider Note ? ? ?CSN: [id number] ?Arrival date & time: 03/03/22  1653 ? ?  ? ?History ? ?Chief Complaint  ?Patient presents with  ? Shortness of Breath  ? ? ?Wayne Dudley is a 88 y.o. male. ? ?HPI ? ?  ? ?88 year old male comes to the ER with chief complaint of low O2 sats. ?Patient has history of hypertension, constipation.  He lives at home with family members.  He has some neuropathy, and his mobility has declined because of it.  He had physical therapy coming home today.  They normally do not check the O2 sats per family, but today they did, and patient's O2 sats were reading low.  They had some home monitor that they checked O2 sats on later in the day, the O2 sats were reading in the 20s, prompting the patient's family to bring him to the ER. ? ?Patient indicates that he has not had any increased chest pain, shortness of breath, dizziness, lightheadedness or near fainting. ? ?There is no history of PE, DVT.  Patient also denies any blood loss. ? ? ?Home Medications ?Prior to Admission medications   ?Medication Sig Start Date End Date Taking? Authorizing Provider  ?acetaminophen (TYLENOL) 325 MG tablet Take 325 mg by mouth every 6 (six) hours as needed. Taking 2 every 8-9 hours. Getting 6/day total per son    [provider]  ?acetaminophen (TYLENOL) 500 MG tablet Take 500 mg by mouth every 6 (six) hours as needed.    [provider]  ?aspirin 81 MG chewable tablet Chew by mouth daily.    [provider]  ?hydrochlorothiazide (HYDRODIURIL) 25 MG tablet Take 25 mg by mouth daily.    [provider]  ?nortriptyline (PAMELOR) 75 MG capsule Take 75 mg by mouth at bedtime.    [provider]  ?sennosides-docusate sodium (SENOKOT-S) 8.6-50 MG tablet Take 1 tablet by mouth daily. As needed    [provider]  ?   ? ?Allergies    ?Gabapentin and Prednisone   ? ?Review of Systems   ?Review of Systems  ?All other  systems reviewed and are negative. ? ?Physical Exam ?Updated Vital Signs ?BP (!) 158/71   Pulse (!) 57   Temp 97.7 ?F (36.5 ?C) (Oral)   Resp 19   Ht 5\' 9"  (1.753 m)   Wt 76.7 kg   SpO2 100%   BMI 24.96 kg/m?  ?Physical Exam ?Vitals and nursing note reviewed.  ?Constitutional:   ?   Appearance: He is well-developed.  ?HENT:  ?   Head: Atraumatic.  ?Cardiovascular:  ?   Rate and Rhythm: Normal rate.  ?   Heart sounds: Murmur heard.  ?Pulmonary:  ?   Effort: Pulmonary effort is normal.  ?Musculoskeletal:  ?   Cervical back: Neck supple.  ?   Right lower leg: No edema.  ?   Left lower leg: No edema.  ?   Comments: Left lower extremity thickness is smaller than right side, it is chronic.  ?Skin: ?   General: Skin is warm.  ?Neurological:  ?   Mental Status: He is alert and oriented to person, place, and time.  ? ? ?ED Results / Procedures / Treatments   ?Labs ?(all labs ordered are listed, but only abnormal results are displayed) ?Labs Reviewed  ?BASIC METABOLIC PANEL - Abnormal; Notable for the following components:  ?    Result Value  ? Sodium 132 (*)   ? Chloride 90 (*)   ?  Glucose, Bld 108 (*)   ? GFR, Estimated 59 (*)   ? All other components within normal limits  ?CBC WITH DIFFERENTIAL/PLATELET - Abnormal; Notable for the following components:  ? Hemoglobin 12.6 (*)   ? All other components within normal limits  ? ? ?EKG ?None ? ?Radiology ?DG Chest 2 View ? ?Result Date: 03/03/2022 ?CLINICAL DATA:  Hypoxia. EXAM: CHEST - 2 VIEW COMPARISON:  None Available. FINDINGS: Mild cardiomegaly is noted. Bibasilar atelectasis is noted with small bilateral pleural effusions, left greater than right. Bony thorax is unremarkable. IMPRESSION: Bibasilar atelectasis with small bilateral pleural effusions, left greater than right. Electronically Signed   By: Lupita Raider M.D.   On: 03/03/2022 18:34   ? ?Procedures ?Procedures  ? ? ?Medications Ordered in ED ?Medications  ?sodium chloride 0.9 % bolus 500 mL (500 mLs  Intravenous New Bag/Given 03/03/22 1836)  ? ? ?ED Course/ Medical Decision Making/ A&P ?Clinical Course as of 03/03/22 2007  ?Wed Mar 03, 2022  ?2007 The patient appears reasonably screened and/or stabilized for discharge and I doubt any other medical condition or other [facility] requiring further screening, evaluation, or treatment in the ED at this time prior to discharge. ?  ?Results from the ER workup discussed with the patient face to face and all questions answered to the best of my ability. ?The patient is safe for discharge with strict return precautions. ? ? [AN]  ?  ?Clinical Course User Index ?[AN] Derwood Kaplan, MD  ? ?                        ?Medical Decision Making ?Amount and/or Complexity of Data Reviewed ?Labs: ordered. ?Radiology: ordered. ? ? ?88 year old male comes in with chief complaint of low oxygen saturation recordings at home.  Patient really has not had any changes to his overall, day to day wellbeing. ? ?It appears that the home PT had checked the O2 sats, and they had read low.  Family subsequently checked pulse ox with home monitors and his O2 sats were lower with that machine. ? ?Patient denies any chest pain, shortness of breath, cough. ? ?His cardiac exam reveals holosystolic murmur, lung exam is clear. ? ?X-ray ordered to ensure there is no large pleural effusion or concerns for pericardial effusion.  X-ray visualized independently, I do not see any concerning findings.  Radiologist interprets a small pleural effusion. ? ?Patient has a systolic murmur, but it is likely not new.  Given that he is not symptomatic, it is clinically not significant for this visit.  Patient CBC did not reveal any severe anemia. ? ?Overall, patient, family member are both reassured about O2 sats, which are consistently reading 95% or above in the ED. ?Stable for discharge. ? ?Final Clinical Impression(s) / ED Diagnoses ?Final diagnoses:  ?Oxygen saturation greater than 90%  ? ? ?Rx / DC Orders ?ED Discharge  Orders   ? ? None  ? ?  ? ? ?  ?Derwood Kaplan, MD ?03/03/22 2005 ? ?  ?Derwood Kaplan, MD ?03/03/22 2007 ? ?

## 2022-03-03 NOTE — ED Triage Notes (Signed)
Pt from home c/o of low oxygen saturation. Pt walks 150 steps with physical therapist and therapist stated that his oxygenation was low with exertion. Pt is not on oxygen at home.  ?

## 2022-03-03 NOTE — ED Notes (Signed)
RT assessed in triage stated that Pt slightly diminished but clear ?

## 2022-07-02 DEATH — deceased

## 2024-01-04 IMAGING — DX DG CHEST 2V
2 series · 2 of 2 positions shown · non-contrast
Comparison: None Available.

CLINICAL DATA: Hypoxia.

EXAM:
CHEST - 2 VIEW

[chest lat]
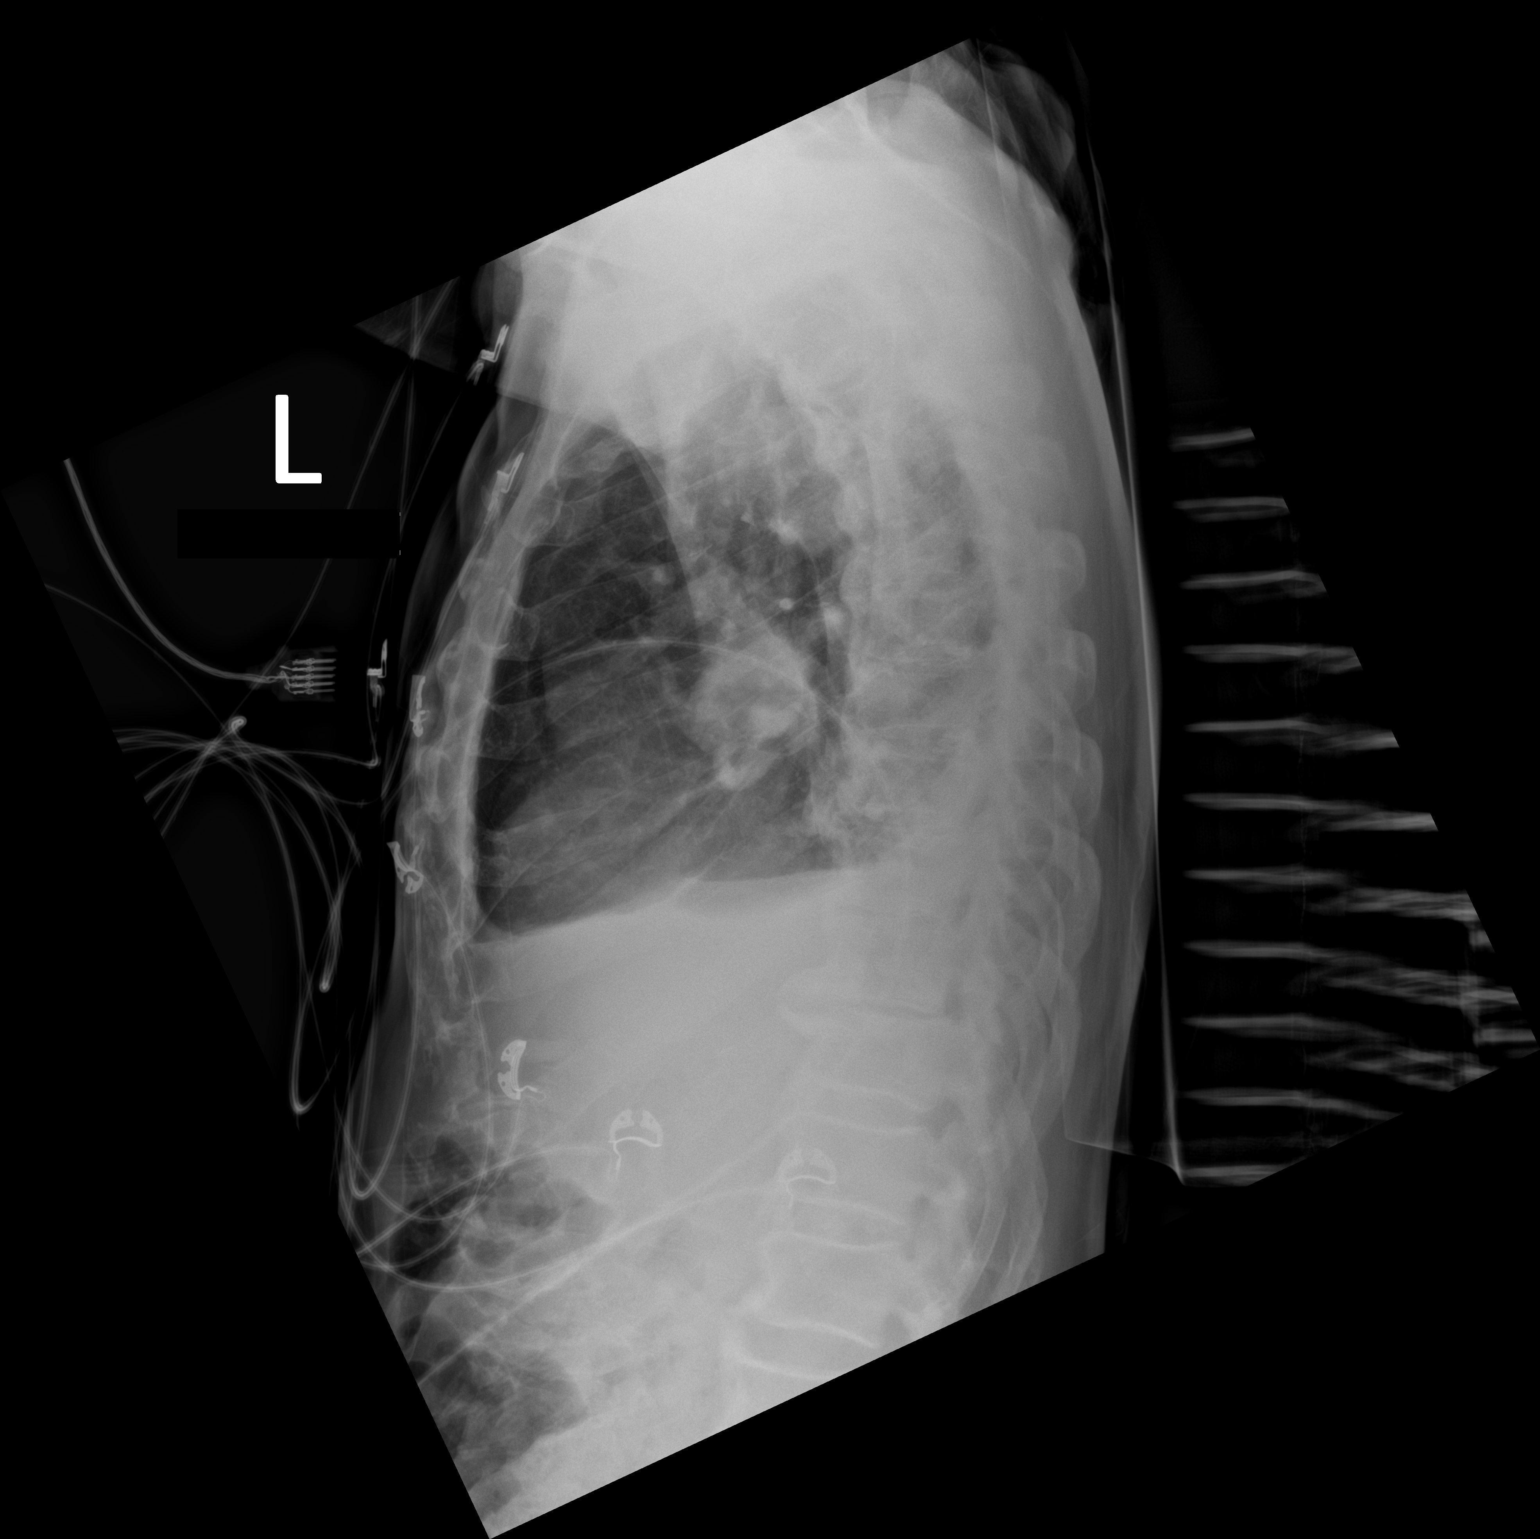

[chest ap]
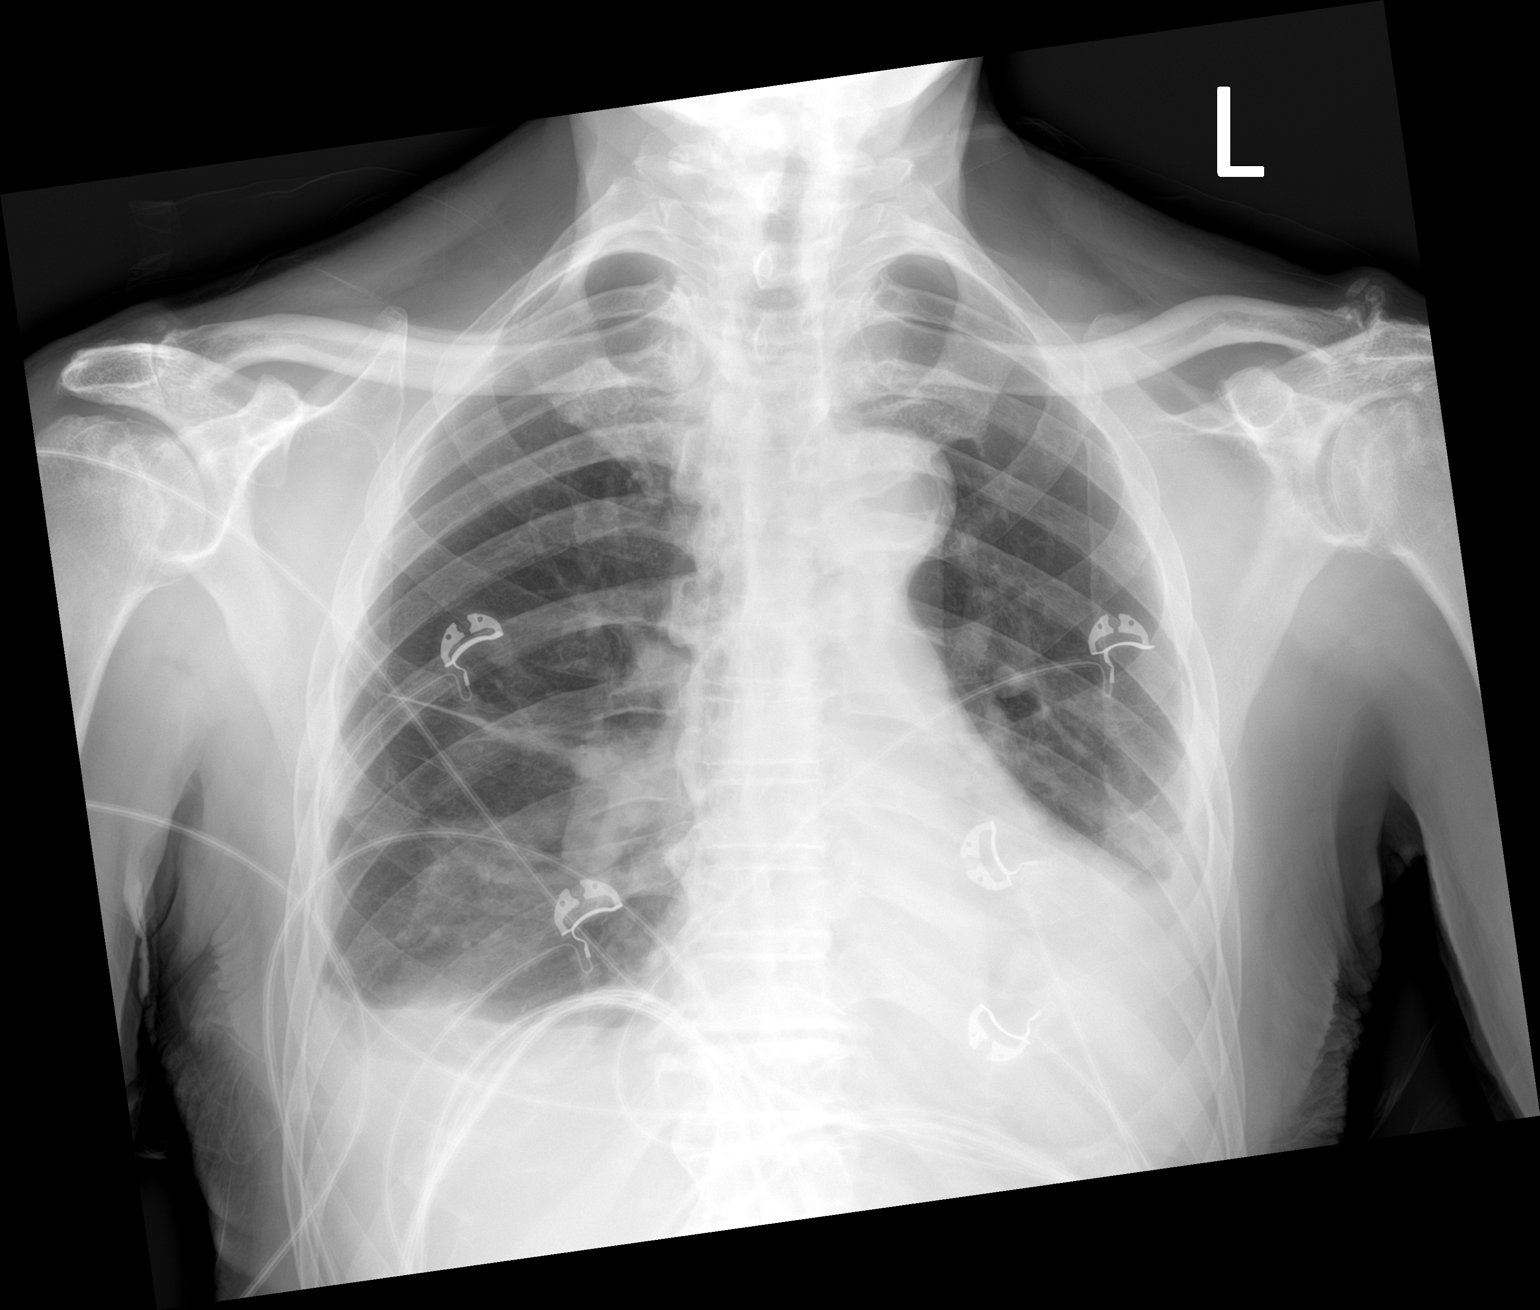

[2 of 2 positions shown; findings below may reference images not displayed]

FINDINGS: Mild cardiomegaly is noted. Bibasilar atelectasis is noted with
small bilateral pleural effusions, left greater than right. Bony
thorax is unremarkable.
IMPRESSION: Bibasilar atelectasis with small bilateral pleural effusions, left
greater than right.
